# Patient Record
Sex: Female | Born: 2000 | Race: Black or African American | Hispanic: No | Marital: Single | Smoking: Never smoker
Health system: Southern US, Community
[De-identification: ages and names within clinical notes are randomized; demographics above are authoritative.]

## PROBLEM LIST (undated history)

## (undated) DIAGNOSIS — Z973 Presence of spectacles and contact lenses: Secondary | ICD-10-CM

## (undated) DIAGNOSIS — F419 Anxiety disorder, unspecified: Secondary | ICD-10-CM

## (undated) DIAGNOSIS — K5900 Constipation, unspecified: Secondary | ICD-10-CM

## (undated) DIAGNOSIS — E079 Disorder of thyroid, unspecified: Secondary | ICD-10-CM

## (undated) DIAGNOSIS — J189 Pneumonia, unspecified organism: Secondary | ICD-10-CM

## (undated) DIAGNOSIS — F32A Depression, unspecified: Secondary | ICD-10-CM

## (undated) DIAGNOSIS — N809 Endometriosis, unspecified: Secondary | ICD-10-CM

## (undated) DIAGNOSIS — E039 Hypothyroidism, unspecified: Secondary | ICD-10-CM

## (undated) HISTORY — PX: NO PAST SURGERIES: SHX2092

---
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
## 2021-01-01 ENCOUNTER — Emergency Department (HOSPITAL_COMMUNITY): Payer: 59

## 2021-01-01 DIAGNOSIS — Z8249 Family history of ischemic heart disease and other diseases of the circulatory system: Secondary | ICD-10-CM | POA: Diagnosis not present

## 2021-01-01 DIAGNOSIS — N736 Female pelvic peritoneal adhesions (postinfective): Secondary | ICD-10-CM | POA: Diagnosis present

## 2021-01-01 DIAGNOSIS — R109 Unspecified abdominal pain: Secondary | ICD-10-CM | POA: Diagnosis present

## 2021-01-01 DIAGNOSIS — E039 Hypothyroidism, unspecified: Secondary | ICD-10-CM | POA: Diagnosis present

## 2021-01-01 DIAGNOSIS — Z833 Family history of diabetes mellitus: Secondary | ICD-10-CM | POA: Diagnosis not present

## 2021-01-01 DIAGNOSIS — A419 Sepsis, unspecified organism: Secondary | ICD-10-CM | POA: Diagnosis present

## 2021-01-01 DIAGNOSIS — Z8616 Personal history of COVID-19: Secondary | ICD-10-CM | POA: Diagnosis not present

## 2021-01-01 DIAGNOSIS — N809 Endometriosis, unspecified: Secondary | ICD-10-CM | POA: Diagnosis present

## 2021-01-01 LAB — PROTIME-INR
INR: 1.1 (ref 0.8–1.2)
Prothrombin Time: 13.9 seconds (ref 11.4–15.2)

## 2021-01-01 LAB — URINALYSIS, ROUTINE W REFLEX MICROSCOPIC
Bilirubin Urine: NEGATIVE
Glucose, UA: NEGATIVE mg/dL
Ketones, ur: NEGATIVE mg/dL
Nitrite: NEGATIVE
Protein, ur: NEGATIVE mg/dL
Specific Gravity, Urine: 1.02 (ref 1.005–1.030)
pH: 6 (ref 5.0–8.0)

## 2021-01-01 LAB — RESP PANEL BY RT-PCR (FLU A&B, COVID) ARPGX2
Influenza A by PCR: NEGATIVE
Influenza B by PCR: NEGATIVE
SARS Coronavirus 2 by RT PCR: NEGATIVE

## 2021-01-01 LAB — CBC
HCT: 31.8 % — ABNORMAL LOW (ref 36.0–46.0)
Hemoglobin: 10 g/dL — ABNORMAL LOW (ref 12.0–15.0)
MCH: 24.6 pg — ABNORMAL LOW (ref 26.0–34.0)
MCHC: 31.4 g/dL (ref 30.0–36.0)
MCV: 78.1 fL — ABNORMAL LOW (ref 80.0–100.0)
Platelets: 273 10*3/uL (ref 150–400)
RBC: 4.07 MIL/uL (ref 3.87–5.11)
RDW: 15.9 % — ABNORMAL HIGH (ref 11.5–15.5)
WBC: 13.1 10*3/uL — ABNORMAL HIGH (ref 4.0–10.5)
nRBC: 0 % (ref 0.0–0.2)

## 2021-01-01 LAB — LACTIC ACID, PLASMA: Lactic Acid, Venous: 1.4 mmol/L (ref 0.5–1.9)

## 2021-01-01 LAB — APTT: aPTT: 30 seconds (ref 24–36)

## 2021-01-01 MED ORDER — OXYCODONE-ACETAMINOPHEN 5-325 MG PO TABS
1.0000 | ORAL_TABLET | ORAL | Status: DC | PRN
  Administered 2021-01-01: 1 via ORAL
  Filled 2021-01-01: qty 1

## 2021-01-01 MED ORDER — HYDROMORPHONE HCL 1 MG/ML IJ SOLN
0.2000 mg | INTRAMUSCULAR | Status: DC | PRN
  Administered 2021-01-01 – 2021-01-02 (×5): 0.5 mg via INTRAVENOUS
  Filled 2021-01-01 (×5): qty 1

## 2021-01-01 MED ORDER — ONDANSETRON HCL 4 MG PO TABS: 4.0000 mg | ORAL_TABLET | Freq: Four times a day (QID) | ORAL | Status: DC | PRN

## 2021-01-01 MED ORDER — SODIUM CHLORIDE 0.9 % IV SOLN
2.0000 g | Freq: Once | INTRAVENOUS | Status: AC
  Administered 2021-01-01: 2 g via INTRAVENOUS
  Filled 2021-01-01: qty 20

## 2021-01-01 MED ORDER — METRONIDAZOLE 500 MG/100ML IV SOLN
500.0000 mg | Freq: Once | INTRAVENOUS | Status: AC
  Administered 2021-01-01: 500 mg via INTRAVENOUS
  Filled 2021-01-01: qty 100

## 2021-01-01 MED ORDER — SODIUM CHLORIDE 0.9 % IV SOLN: 1.0000 g | INTRAVENOUS | Status: DC

## 2021-01-01 MED ORDER — LACTATED RINGERS IV BOLUS (SEPSIS)
1000.0000 mL | Freq: Once | INTRAVENOUS | Status: AC
  Administered 2021-01-01: 1000 mL via INTRAVENOUS

## 2021-01-01 MED ORDER — LACTATED RINGERS IV BOLUS (SEPSIS)
250.0000 mL | Freq: Once | INTRAVENOUS | Status: AC
  Administered 2021-01-01: 250 mL via INTRAVENOUS

## 2021-01-01 MED ORDER — KETOROLAC TROMETHAMINE 30 MG/ML IJ SOLN
30.0000 mg | Freq: Once | INTRAMUSCULAR | Status: AC
  Administered 2021-01-01: 30 mg via INTRAVENOUS
  Filled 2021-01-01: qty 1

## 2021-01-01 MED ORDER — METRONIDAZOLE 500 MG/100ML IV SOLN
500.0000 mg | Freq: Two times a day (BID) | INTRAVENOUS | Status: DC
  Administered 2021-01-01 – 2021-01-02 (×2): 500 mg via INTRAVENOUS
  Filled 2021-01-01 (×2): qty 100

## 2021-01-01 MED ORDER — SIMETHICONE 80 MG PO CHEW: 80.0000 mg | CHEWABLE_TABLET | Freq: Four times a day (QID) | ORAL | Status: DC | PRN

## 2021-01-01 MED ORDER — ONDANSETRON HCL 4 MG/2ML IJ SOLN
4.0000 mg | Freq: Four times a day (QID) | INTRAMUSCULAR | Status: DC | PRN
  Administered 2021-01-01 – 2021-01-02 (×3): 4 mg via INTRAVENOUS
  Filled 2021-01-01 (×3): qty 2

## 2021-01-01 MED ORDER — ACETAMINOPHEN 500 MG PO TABS
1000.0000 mg | ORAL_TABLET | Freq: Four times a day (QID) | ORAL | Status: DC
  Administered 2021-01-01: 1000 mg via ORAL
  Filled 2021-01-01: qty 2

## 2021-01-01 MED ORDER — LACTATED RINGERS IV SOLN: INTRAVENOUS | Status: DC

## 2021-01-01 MED ORDER — LEVOTHYROXINE SODIUM 100 MCG PO TABS
100.0000 ug | ORAL_TABLET | Freq: Every day | ORAL | Status: DC
  Administered 2021-01-02: 100 ug via ORAL
  Filled 2021-01-01: qty 1

## 2021-01-01 MED ORDER — DOXYCYCLINE HYCLATE 100 MG PO TABS
100.0000 mg | ORAL_TABLET | Freq: Two times a day (BID) | ORAL | Status: DC
  Administered 2021-01-01 – 2021-01-02 (×2): 100 mg via ORAL
  Filled 2021-01-01 (×2): qty 1

## 2021-01-01 MED ORDER — OXYCODONE HCL 5 MG PO TABS
5.0000 mg | ORAL_TABLET | ORAL | Status: DC | PRN
  Administered 2021-01-01: 5 mg via ORAL
  Filled 2021-01-01: qty 1

## 2021-01-01 MED ORDER — SODIUM CHLORIDE 0.9 % IV SOLN
2.0000 g | INTRAVENOUS | Status: DC
  Administered 2021-01-02: 2 g via INTRAVENOUS
  Filled 2021-01-01: qty 20

## 2021-01-01 MED ORDER — ONDANSETRON HCL 4 MG/2ML IJ SOLN: 4.0000 mg | Freq: Once | INTRAMUSCULAR | Status: DC

## 2021-01-01 MED ORDER — PRENATAL MULTIVITAMIN CH
1.0000 | ORAL_TABLET | Freq: Every day | ORAL | Status: DC
  Administered 2021-01-01: 1 via ORAL
  Filled 2021-01-01: qty 1

## 2021-01-01 MED ORDER — ACETAMINOPHEN 500 MG PO TABS
1000.0000 mg | ORAL_TABLET | Freq: Once | ORAL | Status: AC
  Administered 2021-01-01: 1000 mg via ORAL
  Filled 2021-01-01: qty 2

## 2021-01-01 MED ORDER — IOHEXOL 350 MG/ML SOLN
75.0000 mL | Freq: Once | INTRAVENOUS | Status: AC | PRN
  Administered 2021-01-01: 75 mL via INTRAVENOUS

## 2021-01-01 MED ORDER — SODIUM CHLORIDE 0.9 % IV SOLN
2.0000 g | INTRAVENOUS | Status: DC
  Filled 2021-01-01: qty 20

## 2021-01-01 MED ORDER — HYDROMORPHONE HCL 1 MG/ML IJ SOLN
0.5000 mg | Freq: Once | INTRAMUSCULAR | Status: AC
Start: 2021-01-01 — End: 2021-01-01
  Administered 2021-01-01: 0.5 mg via INTRAVENOUS
  Filled 2021-01-01: qty 1

## 2021-01-01 MED ORDER — ONDANSETRON HCL 4 MG/2ML IJ SOLN
4.0000 mg | Freq: Once | INTRAMUSCULAR | Status: AC | PRN
  Administered 2021-01-01: 4 mg via INTRAVENOUS
  Filled 2021-01-01: qty 2

## 2021-01-01 NOTE — Sepsis Progress Note (Signed)
Following for sepsis monitoring ?

## 2021-01-01 NOTE — ED Provider Notes (Signed)
[facility]-EMERGENCY DEPT [facility] Emergency Department Provider Note MRN:  [id number]  Arrival date & time: 01/01/21     Chief Complaint   Abdominal Pain   History of Present Illness   Rebecca Vaughan is a 20 y.o. year-old female with a history of endometriosis presenting to the ED with chief complaint of abdominal pain.  Location: Diffuse Duration: 2 days Onset: Gradual Timing: Constant Description: Dull ache Severity: Moderate Exacerbating/Alleviating Factors: None Associated Symptoms: Fever Pertinent Negatives: No vaginal bleeding or discharge, chest pain or shortness of breath, no nausea vomiting or diarrhea  Additional History: Feels different from her endometriosis pain  Review of Systems  A complete 10 system review of systems was obtained and all systems are negative except as noted in the HPI and PMH.   Patient's Health History    Past Medical History:  Diagnosis Date   Constipation    COVID 10/2019   fever chills headache x 5 days all symptoms resolved   Endometriosis    Hypothyroidism    Pneumonia as child age 60   right lung   Thyroid disease    hyperthyroid   Wears glasses     Past Surgical History:  Procedure Laterality Date   LAPAROTOMY N/A 05/06/2020   Procedure: EXPLORATORY LAPAROTOMY, LYSIS OF ADHESIONS, DRAINAGE OF ENDOMETRIOMAS, PARTIAL RIGHT OVARIAN CYSTECTOMY;  Surgeon: Marcelle Overlie, MD;  Location: [facility];  Service: Gynecology;  Laterality: N/A;   NO PAST SURGERIES      Family History  Problem Relation Age of Onset   Hypertension Father    Diabetes Father    Hypertension Maternal Grandmother    Hypertension Paternal Grandmother    Diabetes Paternal Grandmother    Hypertension Paternal Grandfather    Diabetes Paternal Grandfather     Social History   Socioeconomic History   Marital status: Single    Spouse name: Not on file   Number of children: Not on file   Years of education: Not on file   Highest  education level: Not on file  Occupational History   Not on file  Tobacco Use   Smoking status: Never   Smokeless tobacco: Never   Tobacco comments:    No smokers at home  Vaping Use   Vaping Use: Never used  Substance and Sexual Activity   Alcohol use: Not Currently   Drug use: Not Currently   Sexual activity: Not on file  Other Topics Concern   Not on file  Social History Narrative   Is in 8th grade at [employer]   Social Determinants of Health   Financial Resource Strain: Not on file  Food Insecurity: Not on file  Transportation Needs: Not on file  Physical Activity: Not on file  Stress: Not on file  Social Connections: Not on file  Intimate Partner Violence: Not on file     Physical Exam   Vitals:   01/01/21 0236 01/01/21 0407  BP: (!) 83/43 120/70  Pulse: (!) 118 86  Resp: 16 17  Temp: (!) 100.9 F (38.3 C) 99.5 F (37.5 C)  SpO2: 100% 100%    CONSTITUTIONAL: Well-appearing, NAD NEURO:  Alert and oriented x 3, no focal deficits EYES:  eyes equal and reactive ENT/NECK:  no LAD, no JVD CARDIO: Tachycardic rate, well-perfused, normal S1 and S2 PULM:  CTAB no wheezing or rhonchi GI/GU:  normal bowel sounds, non-distended, diffuse tenderness with rebound MSK/SPINE:  No gross deformities, no edema SKIN:  no rash, atraumatic PSYCH:  Appropriate  speech and behavior  *Additional and/or pertinent findings included in MDM below  Diagnostic and Interventional Summary    EKG Interpretation  Date/Time:  Sunday January 01 2021 04:00:53 EDT Ventricular Rate:  88 PR Interval:  138 QRS Duration: 96 QT Interval:  366 QTC Calculation: 442 R Axis:   124 Text Interpretation: Suspect arm lead reversal, interpretation assumes no reversal Unusual P axis, possible ectopic atrial rhythm Right axis deviation Pulmonary disease pattern Cannot rule out Inferior infarct , age undetermined Abnormal ECG Confirmed by Kennis Carina [phone] on 01/01/2021 5:12:54 AM        Labs Reviewed  COMPREHENSIVE METABOLIC PANEL - Abnormal; Notable for the following components:      Result Value   Glucose, Bld 108 (*)    All other components within normal limits  CBC WITH DIFFERENTIAL/PLATELET - Abnormal; Notable for the following components:   WBC 12.2 (*)    Hemoglobin 11.7 (*)    MCV 79.0 (*)    MCH 24.3 (*)    RDW 15.7 (*)    Neutro Abs 9.8 (*)    All other components within normal limits  URINALYSIS, ROUTINE W REFLEX MICROSCOPIC - Abnormal; Notable for the following components:   APPearance HAZY (*)    Hgb urine dipstick MODERATE (*)    Leukocytes,Ua MODERATE (*)    Bacteria, UA RARE (*)    All other components within normal limits  RESP PANEL BY RT-PCR (FLU A&B, COVID) ARPGX2  CULTURE, BLOOD (ROUTINE X 2)  CULTURE, BLOOD (ROUTINE X 2)  LIPASE, BLOOD  LACTIC ACID, PLASMA  PROTIME-INR  APTT  LACTIC ACID, PLASMA  I-STAT BETA HCG BLOOD, ED ([facility], [facility], [facility] ONLY)    CT ABDOMEN PELVIS W CONTRAST  Final Result    DG Chest 1 View  Final Result      Medications  lactated ringers infusion (has no administration in time range)  lactated ringers bolus 1,000 mL (1,000 mLs Intravenous New Bag/Given 01/01/21 0342)    And  lactated ringers bolus 1,000 mL (1,000 mLs Intravenous New Bag/Given 01/01/21 0342)    And  lactated ringers bolus 250 mL (has no administration in time range)  metroNIDAZOLE (FLAGYL) IVPB 500 mg (has no administration in time range)  acetaminophen (TYLENOL) tablet 1,000 mg (1,000 mg Oral Given 01/01/21 0241)  cefTRIAXone (ROCEPHIN) 2 g in sodium chloride 0.9 % 100 mL IVPB (2 g Intravenous New Bag/Given 01/01/21 0448)  iohexol (OMNIPAQUE) 350 MG/ML injection 75 mL (75 mLs Intravenous Contrast Given 01/01/21 0423)     Procedures  /  Critical Care .Critical Care E&M Performed by: Sabas Sous, MD  Critical care provider statement:    Critical care time (minutes):  33   Critical care time was exclusive of:  Separately billable  procedures and treating other patients   Critical care was necessary to treat or prevent imminent or life-threatening deterioration of the following conditions:  Sepsis   Critical care was time spent personally by me on the following activities:  Development of treatment plan with patient or surrogate, discussions with consultants, examination of patient, review of old charts, re-evaluation of patient's condition, ordering and review of radiographic studies, ordering and review of laboratory studies and ordering and performing treatments and interventions After initial E/M assessment, critical care services were subsequently performed that were exclusive of separately billable procedures or treatment.    ED Course and Medical Decision Making  I have reviewed the triage vital signs, the nursing notes, and pertinent available records from  the EMR.  Listed above are laboratory and imaging tests that I personally ordered, reviewed, and interpreted and then considered in my medical decision making (see below for details).  Patient with diffuse abdominal pain, guarding on exam.  Febrile, tachycardic, hypotensive, leukocytosis.  Code sepsis initiated, providing 30 cc/kg fluids, intra-abdominal antibiotic coverage, awaiting labs and CT imaging.  Concern for intra-abdominal abscess, perforated viscus, etc.     CT revealing endometriomas with likely superinfection.  Patient responding well to fluids and antibiotics, tachycardia and hypotension resolved.  Case discussed with both Dr. Robin Searing of OB/GYN, will admit.  Elmer Sow. Pilar Plate, MD [facility] Emergency Medicine [facility] [email or web address]  Final Clinical Impressions(s) / ED Diagnoses     ICD-10-CM   1. Intra-abdominal infection  B99.9     2. Sepsis (HCC)  A41.9 DG Chest 1 View    DG Chest 1 View    3. Endometrioma  N80.129     4. Sepsis, due to unspecified organism, unspecified whether acute organ dysfunction present  [facility])  A41.9       ED Discharge Orders     None        Discharge Instructions Discussed with and Provided to Patient:   Discharge Instructions   None       Sabas Sous, MD 01/01/21 [phone]

## 2021-01-01 NOTE — ED Notes (Signed)
Patient transported to X-ray 

## 2021-01-01 NOTE — ED Notes (Signed)
Patient was given a emesis bag. 

## 2021-01-01 NOTE — H&P (Signed)
Rebecca Vaughan is an 20 y.o. female. In February of this year she underwent exploratory laparotomy with Dr Vincente Poli for a 10 cm endometrioma that was partially resected. She was then placed on Orilissa and Slynd (drospirenone). Postoperatively she noted pain relief. In May U/S > 6.6 cm and 3.5 cm right endometriomas and 1.9 cm left endometrioma. Clinically improved so Orilissa continued. Repeat U/S June 30 > 7cm and 3.9cm right endometriomas and 2.1 cm left endometrioma. She was clinically stable and the plan was to repeat U/S in 3 months. She C/O increasing pain over the last 2-3 months with her menses. 2-3 days ago had more pelvic pain. LMP about 6 days ago. She presents to ED with pain. At home denies fever/chills, N/V. Normal BM and normal bladder function. Now more comfortable after IV fluids and tylenol and ibuprofen.  Pertinent Gynecological History: Menses: flow is moderate Bleeding:  Contraception: oral progesterone-only contraceptive DES exposure: denies Blood transfusions: none Sexually transmitted diseases: no past history Previous GYN Procedures:  see above   Last mammogram: Date:  Last pap:  Date:  OB History: G0, P0   Menstrual History: Menarche age: unknown Patient's last menstrual period was 12/25/2020.    Past Medical History:  Diagnosis Date   Constipation    COVID 10/2019   fever chills headache x 5 days all symptoms resolved   Endometriosis    Hypothyroidism    Pneumonia as child age 76   right lung   Thyroid disease    hyperthyroid   Wears glasses     Past Surgical History:  Procedure Laterality Date   LAPAROTOMY N/A 05/06/2020   Procedure: EXPLORATORY LAPAROTOMY, LYSIS OF ADHESIONS, DRAINAGE OF ENDOMETRIOMAS, PARTIAL RIGHT OVARIAN CYSTECTOMY;  Surgeon: Marcelle Overlie, MD;  Location: [facility] [place];  Service: Gynecology;  Laterality: N/A;   NO PAST SURGERIES      Family History  Problem Relation Age of Onset   Hypertension Father     Diabetes Father    Hypertension Maternal Grandmother    Hypertension Paternal Grandmother    Diabetes Paternal Grandmother    Hypertension Paternal Grandfather    Diabetes Paternal Grandfather     Social History:  reports that she has never smoked. She has never used smokeless tobacco. She reports that she does not currently use alcohol. She reports that she does not currently use drugs.  Allergies:  Allergies  Allergen Reactions   Kiwi Extract     Lips get black scabs    (Not in a hospital admission)   Review of Systems  Constitutional:  Negative for chills.  Eyes:  Negative for visual disturbance.  Gastrointestinal:  Positive for abdominal pain.   Blood pressure 119/70, pulse 88, temperature 99.5 F (37.5 C), temperature source Oral, resp. rate 16, height 5\' 3"  (1.6 m), weight 68 kg, last menstrual period 12/25/2020, SpO2 100 %. Physical Exam Constitutional:      Appearance: She is well-developed.  HENT:     Head: Normocephalic.  Cardiovascular:     Rate and Rhythm: Normal rate.  Pulmonary:     Effort: Pulmonary effort is normal.     Breath sounds: Normal breath sounds.  Abdominal:     General: Abdomen is flat. Bowel sounds are normal.     Palpations: Abdomen is soft.     Tenderness: There is abdominal tenderness in the right lower quadrant and left lower quadrant. There is no guarding or rebound.  Neurological:     Mental Status: She is alert.  Ambulates to BR  Results for orders placed or performed during the hospital encounter of 12/31/20 (from the past 24 hour(s))  Comprehensive metabolic panel     Status: Abnormal   Collection Time: 12/31/20  5:24 PM  Result Value Ref Range   Sodium 137 135 - 145 mmol/L   Potassium 3.7 3.5 - 5.1 mmol/L   Chloride 106 98 - 111 mmol/L   CO2 23 22 - 32 mmol/L   Glucose, Bld 108 (H) 70 - 99 mg/dL   BUN 10 6 - 20 mg/dL   Creatinine, Ser 5.59 0.44 - 1.00 mg/dL   Calcium 9.0 8.9 - 74.1 mg/dL   Total Protein 7.3 6.5 - 8.1 g/dL    Albumin 3.8 3.5 - 5.0 g/dL   AST 20 15 - 41 U/L   ALT 15 0 - 44 U/L   Alkaline Phosphatase 55 38 - 126 U/L   Total Bilirubin 0.5 0.3 - 1.2 mg/dL   GFR, Estimated >63 >84 mL/min   Anion gap 8 5 - 15  CBC with Differential     Status: Abnormal   Collection Time: 12/31/20  5:24 PM  Result Value Ref Range   WBC 12.2 (H) 4.0 - 10.5 K/uL   RBC 4.81 3.87 - 5.11 MIL/uL   Hemoglobin 11.7 (L) 12.0 - 15.0 g/dL   HCT 53.6 46.8 - 03.2 %   MCV 79.0 (L) 80.0 - 100.0 fL   MCH 24.3 (L) 26.0 - 34.0 pg   MCHC 30.8 30.0 - 36.0 g/dL   RDW 12.2 (H) 48.2 - 50.0 %   Platelets 324 150 - 400 K/uL   nRBC 0.0 0.0 - 0.2 %   Neutrophils Relative % 81 %   Neutro Abs 9.8 (H) 1.7 - 7.7 K/uL   Lymphocytes Relative 13 %   Lymphs Abs 1.6 0.7 - 4.0 K/uL   Monocytes Relative 6 %   Monocytes Absolute 0.7 0.1 - 1.0 K/uL   Eosinophils Relative 0 %   Eosinophils Absolute 0.0 0.0 - 0.5 K/uL   Basophils Relative 0 %   Basophils Absolute 0.0 0.0 - 0.1 K/uL   Immature Granulocytes 0 %   Abs Immature Granulocytes 0.02 0.00 - 0.07 K/uL  Lipase, blood     Status: None   Collection Time: 12/31/20  5:24 PM  Result Value Ref Range   Lipase 24 11 - 51 U/L  I-Stat beta hCG blood, ED     Status: None   Collection Time: 12/31/20  6:26 PM  Result Value Ref Range   I-stat hCG, quantitative <5.0 <5 mIU/mL   Comment 3          Lactic acid, plasma     Status: None   Collection Time: 01/01/21  3:35 AM  Result Value Ref Range   Lactic Acid, Venous 1.4 0.5 - 1.9 mmol/L  Resp Panel by RT-PCR (Flu A&B, Covid) Nasopharyngeal Swab     Status: None   Collection Time: 01/01/21  3:39 AM   Specimen: Nasopharyngeal Swab; Nasopharyngeal(NP) swabs in vial transport medium  Result Value Ref Range   SARS Coronavirus 2 by RT PCR NEGATIVE NEGATIVE   Influenza A by PCR NEGATIVE NEGATIVE   Influenza B by PCR NEGATIVE NEGATIVE  Protime-INR     Status: None   Collection Time: 01/01/21  3:39 AM  Result Value Ref Range   Prothrombin Time 13.9  11.4 - 15.2 seconds   INR 1.1 0.8 - 1.2  APTT     Status: None  Collection Time: 01/01/21  3:39 AM  Result Value Ref Range   aPTT 30 24 - 36 seconds  Urinalysis, Routine w reflex microscopic     Status: Abnormal   Collection Time: 01/01/21  4:26 AM  Result Value Ref Range   Color, Urine YELLOW YELLOW   APPearance HAZY (A) CLEAR   Specific Gravity, Urine 1.020 1.005 - 1.030   pH 6.0 5.0 - 8.0   Glucose, UA NEGATIVE NEGATIVE mg/dL   Hgb urine dipstick MODERATE (A) NEGATIVE   Bilirubin Urine NEGATIVE NEGATIVE   Ketones, ur NEGATIVE NEGATIVE mg/dL   Protein, ur NEGATIVE NEGATIVE mg/dL   Nitrite NEGATIVE NEGATIVE   Leukocytes,Ua MODERATE (A) NEGATIVE   RBC / HPF 6-10 0 - 5 RBC/hpf   WBC, UA 11-20 0 - 5 WBC/hpf   Bacteria, UA RARE (A) NONE SEEN   Squamous Epithelial / LPF 11-20 0 - 5   Mucus PRESENT     DG Chest 1 View  Result Date: 01/01/2021 CLINICAL DATA:  Sore throat EXAM: CHEST  1 VIEW COMPARISON:  None. FINDINGS: Lungs are clear.  No pleural effusion or pneumothorax. The heart is normal in size. IMPRESSION: No evidence of acute cardiopulmonary disease. Electronically Signed   By: Charline Bills M.D.   On: 01/01/2021 03:12   CT ABDOMEN PELVIS W CONTRAST  Result Date: 01/01/2021 CLINICAL DATA:  Abdominal abscess or infection suspected.  Pain. EXAM: CT ABDOMEN AND PELVIS WITH CONTRAST TECHNIQUE: Multidetector CT imaging of the abdomen and pelvis was performed using the standard protocol following bolus administration of intravenous contrast. CONTRAST:  72mL OMNIPAQUE IOHEXOL 350 MG/ML SOLN COMPARISON:  03/23/2020 FINDINGS: Lower chest:  No contributory findings. Hepatobiliary: No worrisome liver abnormality.No evidence of biliary obstruction or stone. Pancreas: Unremarkable. Spleen: Unremarkable. Adrenals/Urinary Tract: Negative adrenals. No hydronephrosis or stone. Unremarkable bladder. Stomach/Bowel:  No obstruction. No appendicitis. Vascular/Lymphatic: No acute vascular  abnormality. No mass or adenopathy. Reproductive history of endometrial mass with interval decompression recurrent and L thick walled midline to right paramedian collections in the pelvis, the larger of midline measuring up to 7 cm in more lateral component measuring 4 cm. Regional fat stranding and peritoneal fluid. The right ovary is difficult to separate from the cystic pelvic masses; no visible edematous ovary. Other: No ascites or pneumoperitoneum. Presumed dermal inclusion cyst in the subcutaneous left paramedian lower chest Musculoskeletal: No acute abnormalities. IMPRESSION: History of endometriomas with residual or recurrent collections in the pelvis that are newly thick walled with regional inflammatory changes suggesting superinfection. The largest measures 7 cm Electronically Signed   By: Tiburcio Pea M.D.   On: 01/01/2021 04:42    Upon presentation to ED her temp was 99.6> 100.9 > 99.5 Now S/P Rocephin 2 gm IV, IV fluids VS are stable   Assessment/Plan: 20 yo G0P0 with known Stage 4 endometriosis and endometriomas Pain now worse At this time she is stable C/W patient and her mother plan for admission, IV fluids, clear fluids, continue antibiotics CBC this afternoon and will coordinate a plan for further treatment and/or surgery  Roselle Locus II 01/01/2021, 7:11 AM

## 2021-01-01 NOTE — ED Notes (Signed)
Currently denies nausea. Will continue to monitor.

## 2021-01-01 NOTE — Progress Notes (Signed)
Feeling better, still has lower abdominal discomfort but is able to rest Tolerating clear liquids, no N/V since early this am Ambulates to BR without difficulty  Today's Vitals   01/01/21 1240 01/01/21 1319 01/01/21 1342 01/01/21 1429  BP:  109/63 115/68   Pulse:  82 99   Resp:  15 18   Temp:  99.2 F (37.3 C) 98.4 F (36.9 C)   TempSrc:  Oral Oral   SpO2:  99% 100%   Weight:      Height:      PainSc: 2    6    Body mass index is 26.57 kg/m.   Abdomen flat, soft, lower quad tenderness without rebound  Results for orders placed or performed during the hospital encounter of 12/31/20 (from the past 24 hour(s))  I-Stat beta hCG blood, ED     Status: None   Collection Time: 12/31/20  6:26 PM  Result Value Ref Range   I-stat hCG, quantitative <5.0 <5 mIU/mL   Comment 3          Lactic acid, plasma     Status: None   Collection Time: 01/01/21  3:35 AM  Result Value Ref Range   Lactic Acid, Venous 1.4 0.5 - 1.9 mmol/L  Resp Panel by RT-PCR (Flu A&B, Covid) Nasopharyngeal Swab     Status: None   Collection Time: 01/01/21  3:39 AM   Specimen: Nasopharyngeal Swab; Nasopharyngeal(NP) swabs in vial transport medium  Result Value Ref Range   SARS Coronavirus 2 by RT PCR NEGATIVE NEGATIVE   Influenza A by PCR NEGATIVE NEGATIVE   Influenza B by PCR NEGATIVE NEGATIVE  Protime-INR     Status: None   Collection Time: 01/01/21  3:39 AM  Result Value Ref Range   Prothrombin Time 13.9 11.4 - 15.2 seconds   INR 1.1 0.8 - 1.2  APTT     Status: None   Collection Time: 01/01/21  3:39 AM  Result Value Ref Range   aPTT 30 24 - 36 seconds  Urinalysis, Routine w reflex microscopic     Status: Abnormal   Collection Time: 01/01/21  4:26 AM  Result Value Ref Range   Color, Urine YELLOW YELLOW   APPearance HAZY (A) CLEAR   Specific Gravity, Urine 1.020 1.005 - 1.030   pH 6.0 5.0 - 8.0   Glucose, UA NEGATIVE NEGATIVE mg/dL   Hgb urine dipstick MODERATE (A) NEGATIVE   Bilirubin Urine NEGATIVE  NEGATIVE   Ketones, ur NEGATIVE NEGATIVE mg/dL   Protein, ur NEGATIVE NEGATIVE mg/dL   Nitrite NEGATIVE NEGATIVE   Leukocytes,Ua MODERATE (A) NEGATIVE   RBC / HPF 6-10 0 - 5 RBC/hpf   WBC, UA 11-20 0 - 5 WBC/hpf   Bacteria, UA RARE (A) NONE SEEN   Squamous Epithelial / LPF 11-20 0 - 5   Mucus PRESENT   CBC     Status: Abnormal   Collection Time: 01/01/21  3:04 PM  Result Value Ref Range   WBC 13.1 (H) 4.0 - 10.5 K/uL   RBC 4.07 3.87 - 5.11 MIL/uL   Hemoglobin 10.0 (L) 12.0 - 15.0 g/dL   HCT 38.2 (L) 50.5 - 39.7 %   MCV 78.1 (L) 80.0 - 100.0 fL   MCH 24.6 (L) 26.0 - 34.0 pg   MCHC 31.4 30.0 - 36.0 g/dL   RDW 67.3 (H) 41.9 - 37.9 %   Platelets 273 150 - 400 K/uL   nRBC 0.0 0.0 - 0.2 %  A/P: Stage 4 endometriosis with known recurrent endometriomas         Stable         Continue IV ceftriaxone/metronidazole, doxcycline 100mg  po q12 hrs         Clear liquids          D/W Dr , patient's primary gynecologist. Patient is stable and does not have an acute abdomen. She is more comfortable now. Pelvic masses on CT are C/W same sized endometriomas seen on office U/S in June. Will continue antibiotics to treat possible infection. Dr July will see the patient tomorrow am.

## 2021-01-02 LAB — CBC
HCT: 33 % — ABNORMAL LOW (ref 36.0–46.0)
Hemoglobin: 10.3 g/dL — ABNORMAL LOW (ref 12.0–15.0)
MCH: 24.5 pg — ABNORMAL LOW (ref 26.0–34.0)
MCHC: 31.2 g/dL (ref 30.0–36.0)
MCV: 78.6 fL — ABNORMAL LOW (ref 80.0–100.0)
Platelets: 266 10*3/uL (ref 150–400)
RBC: 4.2 MIL/uL (ref 3.87–5.11)
RDW: 15.9 % — ABNORMAL HIGH (ref 11.5–15.5)
WBC: 13.7 10*3/uL — ABNORMAL HIGH (ref 4.0–10.5)
nRBC: 0 % (ref 0.0–0.2)

## 2021-01-02 LAB — RESPIRATORY PANEL BY PCR

## 2021-01-02 NOTE — Progress Notes (Signed)
Patient discharged to [facility] transported by [facility].

## 2021-01-02 NOTE — Progress Notes (Signed)
Called [facility] for report, report given to [place]. Transport not available at this time. [facility] transport will call for update.

## 2021-01-02 NOTE — Progress Notes (Signed)
20 year old with known endometriosis (Stage 4) had drainage of endometriomas in February. She was placed on Orilissa.  She was stable until recently - admitted yesterday with nausea, vomiting and fever. CT scan 7 cm endometrioma with stranding consistent with superinfection. She has been on Rocephin since admission.  She needs to be transferred to [facility] for further care and consideration for minimally invasive surgery

## 2021-01-02 NOTE — Progress Notes (Signed)
S:  This am, patient is still nauseated.  Pain with movement.   O:  BP 114/63 (BP Location: Left Arm)   Pulse (!) 109   Temp 98.9 F (37.2 C) (Oral)   Resp 18   Ht 5\' 3"  (1.6 m)   Wt 68 kg   LMP 12/25/2020   SpO2 97%   BMI 26.57 kg/m  Results for orders placed or performed during the hospital encounter of 12/31/20 (from the past 24 hour(s))  CBC     Status: Abnormal   Collection Time: 01/01/21  3:04 PM  Result Value Ref Range   WBC 13.1 (H) 4.0 - 10.5 K/uL   RBC 4.07 3.87 - 5.11 MIL/uL   Hemoglobin 10.0 (L) 12.0 - 15.0 g/dL   HCT 01/03/21 (L) 49.1 - 79.1 %   MCV 78.1 (L) 80.0 - 100.0 fL   MCH 24.6 (L) 26.0 - 34.0 pg   MCHC 31.4 30.0 - 36.0 g/dL   RDW 50.5 (H) 69.7 - 94.8 %   Platelets 273 150 - 400 K/uL   nRBC 0.0 0.0 - 0.2 %  CBC     Status: Abnormal   Collection Time: 01/02/21  1:18 AM  Result Value Ref Range   WBC 13.7 (H) 4.0 - 10.5 K/uL   RBC 4.20 3.87 - 5.11 MIL/uL   Hemoglobin 10.3 (L) 12.0 - 15.0 g/dL   HCT 01/04/21 (L) 55.3 - 74.8 %   MCV 78.6 (L) 80.0 - 100.0 fL   MCH 24.5 (L) 26.0 - 34.0 pg   MCHC 31.2 30.0 - 36.0 g/dL   RDW 27.0 (H) 78.6 - 75.4 %   Platelets 266 150 - 400 K/uL   nRBC 0.0 0.0 - 0.2 %   Abdomen mildy distended  Tender diffusely   IMPRESSION: Stage 4 endometriosis with known recurrent endometriomas Possible superinfection  This patient is most likely going to required another surgery . 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 Spoke to [facility] Dr. [facility] - reviewed case - they have agreed to accept transfer of patient today.  Patient received first COVID vaccine - no booster  COVID swab just performed - results pending.

## 2021-01-02 NOTE — Discharge Summary (Signed)
Admission Diagnosis:  Stage 4 Endometriosis  Sepsis  Discharge Diagnosis: Same Endometrioma   Hospital course: 20 year old female G 0 with Stage 4 Endometriosis. Had surgery in February of this year - drainage of endometriomas and significant pelvic adhesions noted at time of surgery.   She was placed on Oril[place]. Seen in June 7 cm endometrioma noted but patient felt clinically fine. Developed acute onset of abdominal pain, nausea and vomiting and fever yesterday.  In ED - CT showed 7 cm endometrioma with stranding ? Superinfection  Results for orders placed or performed during the hospital encounter of 12/31/20 (from the past 24 hour(s))  CBC     Status: Abnormal   Collection Time: 01/01/21  3:04 PM  Result Value Ref Range   WBC 13.1 (H) 4.0 - 10.5 K/uL   RBC 4.07 3.87 - 5.11 MIL/uL   Hemoglobin 10.0 (L) 12.0 - 15.0 g/dL   HCT 76.2 (L) 83.1 - 51.7 %   MCV 78.1 (L) 80.0 - 100.0 fL   MCH 24.6 (L) 26.0 - 34.0 pg   MCHC 31.4 30.0 - 36.0 g/dL   RDW 61.6 (H) 07.3 - 71.0 %   Platelets 273 150 - 400 K/uL   nRBC 0.0 0.0 - 0.2 %  CBC     Status: Abnormal   Collection Time: 01/02/21  1:18 AM  Result Value Ref Range   WBC 13.7 (H) 4.0 - 10.5 K/uL   RBC 4.20 3.87 - 5.11 MIL/uL   Hemoglobin 10.3 (L) 12.0 - 15.0 g/dL   HCT 62.6 (L) 94.8 - 54.6 %   MCV 78.6 (L) 80.0 - 100.0 fL   MCH 24.5 (L) 26.0 - 34.0 pg   MCHC 31.2 30.0 - 36.0 g/dL   RDW 27.0 (H) 35.0 - 09.3 %   Platelets 266 150 - 400 K/uL   nRBC 0.0 0.0 - 0.2 %   Patient admitted and placed on pain medications and Rocephin and Doxycycline. She is only minimally improved today. Abdomen is slightly distended and mildly tender throughout  No surgical abdomen   Recommend transfer to tertiary care center because she will mostly likely require minimally invasive surgery which will be complicated because of pelvic adhesive disease  Spoke to [facility] and they have accepted transfer

## 2021-01-05 DIAGNOSIS — N7093 Salpingitis and oophoritis, unspecified: Secondary | ICD-10-CM | POA: Insufficient documentation

## 2021-01-05 DIAGNOSIS — K5900 Constipation, unspecified: Secondary | ICD-10-CM | POA: Insufficient documentation

## 2021-01-06 LAB — CULTURE, BLOOD (ROUTINE X 2)
Culture: NO GROWTH
Culture: NO GROWTH
Special Requests: ADEQUATE
Special Requests: ADEQUATE

## 2021-08-06 ENCOUNTER — Inpatient Hospital Stay (HOSPITAL_COMMUNITY)
Admission: AD | Admit: 2021-08-06 | Discharge: 2021-08-12 | DRG: 885 | Disposition: A | Discharge status: Home / Self Care | Payer: 59 | Source: Intra-hospital | Attending: Emergency Medicine | Admitting: Emergency Medicine

## 2021-08-06 ENCOUNTER — Emergency Department (HOSPITAL_COMMUNITY)
Admission: EM | Admit: 2021-08-06 | Discharge: 2021-08-06 | Disposition: A | Discharge status: Other Acute Inpatient Hospital | Payer: 59 | Attending: Emergency Medicine | Admitting: Emergency Medicine

## 2021-08-06 ENCOUNTER — Other Ambulatory Visit: Payer: Self-pay

## 2021-08-06 ENCOUNTER — Encounter (HOSPITAL_COMMUNITY): Payer: Self-pay

## 2021-08-06 ENCOUNTER — Other Ambulatory Visit: Payer: Self-pay | Admitting: Psychiatry

## 2021-08-06 ENCOUNTER — Encounter (HOSPITAL_COMMUNITY): Payer: Self-pay | Admitting: Registered Nurse

## 2021-08-06 DIAGNOSIS — F17200 Nicotine dependence, unspecified, uncomplicated: Secondary | ICD-10-CM | POA: Diagnosis present

## 2021-08-06 DIAGNOSIS — F32A Depression, unspecified: Secondary | ICD-10-CM

## 2021-08-06 DIAGNOSIS — Z9151 Personal history of suicidal behavior: Secondary | ICD-10-CM

## 2021-08-06 DIAGNOSIS — F419 Anxiety disorder, unspecified: Secondary | ICD-10-CM

## 2021-08-06 DIAGNOSIS — E039 Hypothyroidism, unspecified: Secondary | ICD-10-CM | POA: Diagnosis present

## 2021-08-06 DIAGNOSIS — F430 Acute stress reaction: Secondary | ICD-10-CM | POA: Diagnosis present

## 2021-08-06 DIAGNOSIS — G8929 Other chronic pain: Secondary | ICD-10-CM | POA: Diagnosis present

## 2021-08-06 DIAGNOSIS — X838XXA Intentional self-harm by other specified means, initial encounter: Secondary | ICD-10-CM | POA: Insufficient documentation

## 2021-08-06 DIAGNOSIS — Z20822 Contact with and (suspected) exposure to covid-19: Secondary | ICD-10-CM | POA: Diagnosis present

## 2021-08-06 DIAGNOSIS — Z8701 Personal history of pneumonia (recurrent): Secondary | ICD-10-CM

## 2021-08-06 DIAGNOSIS — Z8616 Personal history of COVID-19: Secondary | ICD-10-CM | POA: Diagnosis not present

## 2021-08-06 DIAGNOSIS — T43292A Poisoning by other antidepressants, intentional self-harm, initial encounter: Secondary | ICD-10-CM | POA: Insufficient documentation

## 2021-08-06 DIAGNOSIS — Z818 Family history of other mental and behavioral disorders: Secondary | ICD-10-CM

## 2021-08-06 DIAGNOSIS — F332 Major depressive disorder, recurrent severe without psychotic features: Principal | ICD-10-CM | POA: Diagnosis present

## 2021-08-06 DIAGNOSIS — Z8349 Family history of other endocrine, nutritional and metabolic diseases: Secondary | ICD-10-CM

## 2021-08-06 DIAGNOSIS — T50902A Poisoning by unspecified drugs, medicaments and biological substances, intentional self-harm, initial encounter: Secondary | ICD-10-CM

## 2021-08-06 DIAGNOSIS — N809 Endometriosis, unspecified: Secondary | ICD-10-CM | POA: Diagnosis present

## 2021-08-06 DIAGNOSIS — G4700 Insomnia, unspecified: Secondary | ICD-10-CM | POA: Diagnosis present

## 2021-08-06 DIAGNOSIS — Z91018 Allergy to other foods: Secondary | ICD-10-CM

## 2021-08-06 DIAGNOSIS — F329 Major depressive disorder, single episode, unspecified: Secondary | ICD-10-CM | POA: Diagnosis present

## 2021-08-06 DIAGNOSIS — F322 Major depressive disorder, single episode, severe without psychotic features: Principal | ICD-10-CM | POA: Diagnosis present

## 2021-08-06 DIAGNOSIS — T1491XA Suicide attempt, initial encounter: Secondary | ICD-10-CM

## 2021-08-06 HISTORY — DX: Depression, unspecified: F32.A

## 2021-08-06 HISTORY — DX: Anxiety disorder, unspecified: F41.9

## 2021-08-06 LAB — CBC WITH DIFFERENTIAL/PLATELET
Abs Immature Granulocytes: 0.03 10*3/uL (ref 0.00–0.07)
Basophils Absolute: 0 10*3/uL (ref 0.0–0.1)
Basophils Relative: 0 %
Eosinophils Absolute: 0 10*3/uL (ref 0.0–0.5)
Eosinophils Relative: 1 %
HCT: 42.5 % (ref 36.0–46.0)
Hemoglobin: 13.2 g/dL (ref 12.0–15.0)
Immature Granulocytes: 0 %
Lymphocytes Relative: 29 %
Lymphs Abs: 2.5 10*3/uL (ref 0.7–4.0)
MCH: 24.2 pg — ABNORMAL LOW (ref 26.0–34.0)
MCHC: 31.1 g/dL (ref 30.0–36.0)
MCV: 78 fL — ABNORMAL LOW (ref 80.0–100.0)
Monocytes Absolute: 0.6 10*3/uL (ref 0.1–1.0)
Monocytes Relative: 7 %
Neutro Abs: 5.3 10*3/uL (ref 1.7–7.7)
Neutrophils Relative %: 63 %
Platelets: 325 10*3/uL (ref 150–400)
RBC: 5.45 MIL/uL — ABNORMAL HIGH (ref 3.87–5.11)
RDW: 16.2 % — ABNORMAL HIGH (ref 11.5–15.5)
WBC: 8.5 10*3/uL (ref 4.0–10.5)
nRBC: 0 % (ref 0.0–0.2)

## 2021-08-06 LAB — I-STAT BETA HCG BLOOD, ED (MC, WL, AP ONLY): I-stat hCG, quantitative: <5 m[IU]/mL (ref <5)

## 2021-08-06 LAB — COMPREHENSIVE METABOLIC PANEL
ALT: 14 U/L (ref 0–44)
AST: 17 U/L (ref 15–41)
Albumin: 3.7 g/dL (ref 3.5–5.0)
Alkaline Phosphatase: 66 U/L (ref 38–126)
Anion gap: 7 (ref 5–15)
BUN: 7 mg/dL (ref 6–20)
CO2: 21 mmol/L — ABNORMAL LOW (ref 22–32)
Calcium: 9.4 mg/dL (ref 8.9–10.3)
Chloride: 107 mmol/L (ref 98–111)
Creatinine, Ser: 0.72 mg/dL (ref 0.44–1.00)
GFR, Estimated: >60 mL/min (ref >60)
Glucose, Bld: 99 mg/dL (ref 70–99)
Potassium: 3.8 mmol/L (ref 3.5–5.1)
Sodium: 135 mmol/L (ref 135–145)
Total Bilirubin: 0.6 mg/dL (ref 0.3–1.2)
Total Protein: 7.5 g/dL (ref 6.5–8.1)

## 2021-08-06 LAB — RAPID URINE DRUG SCREEN, HOSP PERFORMED
Amphetamines: NOT DETECTED
Barbiturates: NOT DETECTED
Benzodiazepines: NOT DETECTED
Cocaine: NOT DETECTED
Opiates: NOT DETECTED
Tetrahydrocannabinol: NOT DETECTED

## 2021-08-06 LAB — RESP PANEL BY RT-PCR (FLU A&B, COVID) ARPGX2
Influenza A by PCR: NEGATIVE
Influenza B by PCR: NEGATIVE
SARS Coronavirus 2 by RT PCR: NEGATIVE

## 2021-08-06 LAB — SALICYLATE LEVEL: Salicylate Lvl: <7 mg/dL — ABNORMAL LOW (ref 7.0–30.0)

## 2021-08-06 LAB — ETHANOL: Alcohol, Ethyl (B): <10 mg/dL (ref <10)

## 2021-08-06 LAB — ACETAMINOPHEN LEVEL: Acetaminophen (Tylenol), Serum: <10 ug/mL — ABNORMAL LOW (ref 10–30)

## 2021-08-06 MED ORDER — MAGNESIUM HYDROXIDE 400 MG/5ML PO SUSP: 15.0000 mL | Freq: Every day | ORAL | Status: DC | PRN

## 2021-08-06 MED ORDER — ALUM & MAG HYDROXIDE-SIMETH 200-200-20 MG/5ML PO SUSP: 15.0000 mL | ORAL | Status: DC | PRN

## 2021-08-06 MED ORDER — ENSURE ENLIVE PO LIQD
237.0000 mL | Freq: Two times a day (BID) | ORAL | Status: DC
  Administered 2021-08-07 – 2021-08-11 (×5): 237 mL via ORAL
  Filled 2021-08-06 (×15): qty 237

## 2021-08-06 MED ORDER — TRAZODONE HCL 50 MG PO TABS: 50.0000 mg | ORAL_TABLET | Freq: Every evening | ORAL | Status: DC | PRN

## 2021-08-06 MED ORDER — ACETAMINOPHEN 325 MG PO TABS
650.0000 mg | ORAL_TABLET | Freq: Four times a day (QID) | ORAL | Status: DC | PRN
Start: 2021-08-06 — End: 2021-08-12
  Administered 2021-08-06 – 2021-08-11 (×2): 650 mg via ORAL
  Filled 2021-08-06 (×2): qty 2

## 2021-08-06 MED ORDER — HYDROXYZINE HCL 25 MG PO TABS
25.0000 mg | ORAL_TABLET | Freq: Three times a day (TID) | ORAL | Status: DC | PRN
  Administered 2021-08-06 – 2021-08-11 (×7): 25 mg via ORAL
  Filled 2021-08-06 (×7): qty 1

## 2021-08-06 NOTE — ED Notes (Signed)
Pt is currently visited by Ray Church (her mother) whom will also take her belongings upon leaving visitation hours

## 2021-08-06 NOTE — ED Notes (Signed)
Spoke w/ pt's mother who provided pertinent information about pt. Per pt's mother, pt was charged for stealing from her job. The pt's friend's mother took her to turn herself in on Wednesday. Pt's mother said pt told her she felt like she couldn't tell her mom because she thought she would be disappointed in her. Pt's mother is a retired Engineer, structural. Pt's mother reports pt has a court date on June 14th at 0800. Pt's mother's name is Esaw Dace and her phone number is [phone] if further information is needed and for updates.

## 2021-08-06 NOTE — ED Notes (Addendum)
Family member at bedside.

## 2021-08-06 NOTE — ED Notes (Signed)
Patient resting at this time. Even chest rise and fall observed.

## 2021-08-06 NOTE — ED Notes (Signed)
No sitter available per staffing office at this time.

## 2021-08-06 NOTE — ED Notes (Signed)
Called staffing for a sitter, not sitters available at this time per staffing. Notified CN. Pt has to have monitoring equipment d/t overdose. Tech being pulled to monitor pt until a sitter is available to maintain pt safety.

## 2021-08-06 NOTE — ED Provider Notes (Signed)
[facility] EMERGENCY DEPARTMENT Provider Note   CSN: [id number] Arrival date & time: 08/06/21  2947     History  Chief Complaint  Patient presents with  . Drug Overdose    Pt endorsed intentional overdose she initially described as a suicide attempt.  She now says that she was ''trying to hurt herself.''    Rebecca Vaughan is a 21 y.o. female.  Patient Overdosed on 6 tablets of 50 mg trazodone at 6:00 AM in suicide attempt. She has been feeling depressed for "a while." She has seen outside psychiatry and is feeling no better. Denies AVH, HI. No hx of psych hospitalization.  NO other drugs or etoh    Drug Overdose This is a new problem. Episode onset: 1 hour ago. Pertinent negatives include no chest pain, no abdominal pain, no headaches and no shortness of breath.      Home Medications Prior to Admission medications   Not on File      Allergies    Kiwi extract    Review of Systems   Review of Systems  Respiratory:  Negative for shortness of breath.   Cardiovascular:  Negative for chest pain.  Gastrointestinal:  Negative for abdominal pain.  Neurological:  Negative for headaches.   Physical Exam Updated Vital Signs BP 98/61 (BP Location: Left Arm)   Pulse 61   Temp 98.4 F (36.9 C) (Oral)   Resp 16   Ht 5\' 3"  (1.6 m)   Wt 70.3 kg   SpO2 100%   BMI 27.46 kg/m  Physical Exam Vitals and nursing note reviewed.  Constitutional:      General: She is not in acute distress.    Appearance: She is well-developed. She is not diaphoretic.  HENT:     Head: Normocephalic and atraumatic.     Right Ear: External ear normal.     Left Ear: External ear normal.     Nose: Nose normal.     Mouth/Throat:     Mouth: Mucous membranes are moist.  Eyes:     General: No scleral icterus.    Conjunctiva/sclera: Conjunctivae normal.  Cardiovascular:     Rate and Rhythm: Normal rate and regular rhythm.     Heart sounds: Normal heart sounds. No murmur heard.    No friction rub. No gallop.  Pulmonary:     Effort: Pulmonary effort is normal. No respiratory distress.     Breath sounds: Normal breath sounds.  Abdominal:     General: Bowel sounds are normal. There is no distension.     Palpations: Abdomen is soft. There is no mass.     Tenderness: There is no abdominal tenderness. There is no guarding.  Musculoskeletal:     Cervical back: Normal range of motion.  Skin:    General: Skin is warm and dry.  Neurological:     Mental Status: She is oriented to person, place, and time. She is lethargic.  Psychiatric:        Behavior: Behavior normal.    ED Results / Procedures / Treatments   Labs (all labs ordered are listed, but only abnormal results are displayed) Labs Reviewed  COMPREHENSIVE METABOLIC PANEL - Abnormal; Notable for the following components:      Result Value   CO2 21 (*)    All other components within normal limits  SALICYLATE LEVEL - Abnormal; Notable for the following components:   Salicylate Lvl <7.0 (*)    All other components within normal limits  ACETAMINOPHEN LEVEL - Abnormal; Notable for the following components:   Acetaminophen (Tylenol), Serum <10 (*)    All other components within normal limits  CBC WITH DIFFERENTIAL/PLATELET - Abnormal; Notable for the following components:   RBC 5.45 (*)    MCV 78.0 (*)    MCH 24.2 (*)    RDW 16.2 (*)    All other components within normal limits  RESP PANEL BY RT-PCR (FLU A&B, COVID) ARPGX2  ETHANOL  RAPID URINE DRUG SCREEN, HOSP PERFORMED  I-STAT BETA HCG BLOOD, ED (MC, [facility], AP ONLY)  CBG MONITORING, ED    EKG EKG Interpretation  Date/Time:  Sunday Aug 06 2021 06:49:07 EDT Ventricular Rate:  98 PR Interval:  128 QRS Duration: 82 QT Interval:  342 QTC Calculation: 436 R Axis:   69 Text Interpretation: Normal sinus rhythm with sinus arrhythmia Normal ECG twaves flattened from prior EKG When compared with ECG of 01-Jan-2021 04:00, PREVIOUS ECG IS PRESENT Confirmed by  Gwyneth Sprout ([id number]) on 08/06/2021 8:57:55 AM  Radiology No results found.  Procedures Procedures    Medications Ordered in ED Medications - No data to display  ED Course/ Medical Decision Making/ A&P Clinical Course as of 08/06/21 1550  Sun Aug 06, 2021  0725 Patient took trazodone. Will monitor for 6 hours due to rusk of cns, respiratory depression and potential seizures.  Will eval for co-ingestion.   [AH]  0924 CBC WITH DIFFERENTIAL(!) [AH]  1547 Salicylate level(!) [AH]  1547 Acetaminophen level(!) [AH]  1547 Comprehensive metabolic panel(!) [AH]  1547 Ethanol [AH]  1547 I-Stat beta hCG blood, ED [AH]  1548 Repeat EKG Repeat ekg shows no qt prolongation [AH]    Clinical Course User Index [AH] Arthor Captain, PA-C                           Medical Decision Making Patient is here for intentional overdose- Now medically clear for psych eval  Amount and/or Complexity of Data Reviewed Labs: ordered. Decision-making details documented in ED Course.            Final Clinical Impression(s) / ED Diagnoses Final diagnoses:  Intentional drug overdose, initial encounter [facility])  Suicide attempt [facility])    Rx / DC Orders ED Discharge Orders     None         Arthor Captain, PA-C 08/06/21 1550    Gwyneth Sprout, MD 08/10/21 [phone]

## 2021-08-06 NOTE — ED Notes (Signed)
Lab at bedside

## 2021-08-06 NOTE — Tx Team (Signed)
Initial Treatment Plan 08/06/2021 9:00 PM Rebecca Vaughan [id number]    PATIENT STRESSORS: Educational concerns   Financial difficulties   Legal issue   Substance abuse     PATIENT STRENGTHS: Ability for insight  Average or above average intelligence  Motivation for treatment/growth  Supportive family/friends  Work skills    PATIENT IDENTIFIED PROBLEMS: SI  Depression  School student at [employer]  Pending court date               DISCHARGE CRITERIA:  Improved stabilization in mood, thinking, and/or behavior  PRELIMINARY DISCHARGE PLAN: Return to previous living arrangement  PATIENT/FAMILY INVOLVEMENT: This treatment plan has been presented to and reviewed with the patient, Rebecca Vaughan, and/or family member.  The patient and family have been given the opportunity to ask questions and make suggestions.  Floyce Stakes, RN 08/06/2021, 9:00 PM

## 2021-08-06 NOTE — ED Notes (Signed)
Placed call to safe transport to transport patient to behavioral health hospital. Received no answer and left voicemail.

## 2021-08-06 NOTE — ED Notes (Signed)
Attempted IV start x 2. EDT attempted lab draw x 1. Notified phlebotomy

## 2021-08-06 NOTE — Progress Notes (Signed)
Psychoeducational Group Note  Date:  08/06/2021 Time:  2015  Group Topic/Focus:  Wrap up group  Participation Level: Did Not Attend  Participation Quality:  Not Applicable  Affect:  Not Applicable  Cognitive:  Not Applicable  Insight:  Not Applicable  Engagement in Group: Not Applicable  Additional Comments:  Did not attend.   Marcille Buffy 08/06/2021, 9:48 PM
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
## 2021-08-06 NOTE — ED Notes (Signed)
Patient discharged to [facility] without issue. Patient transported to [facility] by [employer]. Paperwork given to [employer] driver with no issue. BH personnel made aware of departure.

## 2021-08-06 NOTE — ED Triage Notes (Signed)
Pt reports taking 6 trazodone with intent to self harm roughly 1 hour ago. Pt endorses several episodes of vomiting since ingestion.  HX: SI

## 2021-08-06 NOTE — Progress Notes (Signed)
Patient is a 21 year old female admitted voluntarily after SI attempt where she took trazodone. She reports feeling overwhelmed with life currently. She apparently stole from her job and has a pending court date coming up soon. She reports having had several recent surgeries for endometriosis. She lives with her mother currently and is taking a break from [place]. She reports smoking THC occasionally, denies smoking tobacco products and drinks less than monthly. Skin assessment completed and old surgery scars on her abdomen are noted. Support givne and safety maintained with 15 min checks. Patient oriented to unit.

## 2021-08-06 NOTE — ED Notes (Signed)
Pt belongings were placed in locker 5 per previous shift staff member.

## 2021-08-06 NOTE — ED Notes (Signed)
Report given to on coming sitter

## 2021-08-06 NOTE — ED Notes (Signed)
Pt belongings inventoried and in in locker number #5, security called to wand pt but has not arrived yet.

## 2021-08-06 NOTE — Progress Notes (Signed)
[facility] LCSW Progress Note   08/06/2021    3:58 PM  Rebecca Vaughan   [id number]   Type of Contact and Topic:  Psychiatric Bed Placement   Pt accepted to [facility] 402-1 contingent on negative COVID test.   Patient meets inpatient criteria per Assunta Found, NP  The attending provider will be Phineas Inches, MD  Call report to [phone]    Amelia Jo, RN @ [facility] ED notified.     Signed:  Corky Crafts, MSW, LCSWA, LCAS 08/06/2021 4:00 PM

## 2021-08-07 ENCOUNTER — Encounter (HOSPITAL_COMMUNITY): Payer: Self-pay

## 2021-08-07 DIAGNOSIS — F322 Major depressive disorder, single episode, severe without psychotic features: Secondary | ICD-10-CM

## 2021-08-07 DIAGNOSIS — F430 Acute stress reaction: Secondary | ICD-10-CM | POA: Diagnosis present

## 2021-08-07 LAB — FOLATE: Folate: 7.8 ng/mL (ref >5.9)

## 2021-08-07 LAB — VITAMIN D 25 HYDROXY (VIT D DEFICIENCY, FRACTURES): Vit D, 25-Hydroxy: 14.88 ng/mL — ABNORMAL LOW (ref 30–100)

## 2021-08-07 LAB — TSH: TSH: 2.47 u[IU]/mL (ref 0.350–4.500)

## 2021-08-07 LAB — MAGNESIUM: Magnesium: 2.3 mg/dL (ref 1.7–2.4)

## 2021-08-07 LAB — VITAMIN B12: Vitamin B-12: 225 pg/mL (ref 180–914)

## 2021-08-07 MED ORDER — LORAZEPAM 1 MG PO TABS: 1.0000 mg | ORAL_TABLET | Freq: Four times a day (QID) | ORAL | Status: DC | PRN

## 2021-08-07 MED ORDER — WHITE PETROLATUM EX OINT
TOPICAL_OINTMENT | CUTANEOUS | Status: AC
  Filled 2021-08-07: qty 5

## 2021-08-07 MED ORDER — VENLAFAXINE HCL ER 37.5 MG PO CP24
37.5000 mg | ORAL_CAPSULE | Freq: Every day | ORAL | Status: DC
  Administered 2021-08-08 – 2021-08-09 (×2): 37.5 mg via ORAL
  Filled 2021-08-07 (×5): qty 1

## 2021-08-07 MED ORDER — TOPIRAMATE 25 MG PO TABS
25.0000 mg | ORAL_TABLET | Freq: Two times a day (BID) | ORAL | Status: DC
  Administered 2021-08-07 – 2021-08-12 (×10): 25 mg via ORAL
  Filled 2021-08-07 (×16): qty 1

## 2021-08-07 MED ORDER — LORAZEPAM 2 MG/ML IJ SOLN: 1.0000 mg | Freq: Four times a day (QID) | INTRAMUSCULAR | Status: DC | PRN

## 2021-08-07 NOTE — BH IP Treatment Plan (Signed)
Interdisciplinary Treatment and Diagnostic Plan Update  08/07/2021 Time of Session: 10:15am Rebecca Vaughan MRN: [id number]  Principal Diagnosis: MDD (major depressive disorder), severe (HCC)  Secondary Diagnoses: Principal Problem:   MDD (major depressive disorder), severe (HCC)   Current Medications:  Current Facility-Administered Medications  Medication Dose Route Frequency Provider Last Rate Last Admin   acetaminophen (TYLENOL) tablet 650 mg  650 mg Oral Q6H PRN Nwoko, Uchenna E, PA   650 mg at 08/06/21 2152   alum & mag hydroxide-simeth (MAALOX/MYLANTA) 200-200-20 MG/5ML suspension 15 mL  15 mL Oral Q4H PRN Nwoko, Uchenna E, PA       feeding supplement (ENSURE ENLIVE / ENSURE PLUS) liquid 237 mL  237 mL Oral BID BM Massengill, Harrold Donath, MD   237 mL at 08/07/21 0843   hydrOXYzine (ATARAX) tablet 25 mg  25 mg Oral TID PRN Nwoko, Uchenna E, PA   25 mg at 08/07/21 0844   magnesium hydroxide (MILK OF MAGNESIA) suspension 15 mL  15 mL Oral Daily PRN Nwoko, Uchenna E, PA       traZODone (DESYREL) tablet 50 mg  50 mg Oral QHS PRN Nwoko, Uchenna E, PA       PTA Medications: Medications Prior to Admission  Medication Sig Dispense Refill Last Dose   Elagolix Sodium (ORILISSA) 200 MG TABS Take 200 mg by mouth in the morning and at bedtime.   08/05/2021   escitalopram (LEXAPRO) 10 MG tablet Take 10 mg by mouth daily.   08/05/2021   ketorolac (TORADOL) 10 MG tablet Take 10 mg by mouth every 6 (six) hours as needed (For menstrual pain).   last month   LARIN FE 1/20 1-20 MG-MCG tablet Take 1 tablet by mouth daily.   08/05/2021   levothyroxine (SYNTHROID) 100 MCG tablet Take 100 mcg by mouth daily.   08/05/2021   traZODone (DESYREL) 50 MG tablet Take 50 mg by mouth at bedtime as needed for sleep.   08/06/2021    Patient Stressors: Educational concerns   Financial difficulties   Legal issue   Substance abuse    Patient Strengths: Ability for insight  Average or above average intelligence   Motivation for treatment/growth  Supportive family/friends  Work skills   Treatment Modalities: Medication Management, Group therapy, Case management,  1 to 1 session with clinician, Psychoeducation, Recreational therapy.   Physician Treatment Plan for Primary Diagnosis: MDD (major depressive disorder), severe (HCC) Long Term Goal(s):     Short Term Goals:    Medication Management: Evaluate patient's response, side effects, and tolerance of medication regimen.  Therapeutic Interventions: 1 to 1 sessions, Unit Group sessions and Medication administration.  Evaluation of Outcomes: Progressing  Physician Treatment Plan for Secondary Diagnosis: Principal Problem:   MDD (major depressive disorder), severe (HCC)  Long Term Goal(s):     Short Term Goals:       Medication Management: Evaluate patient's response, side effects, and tolerance of medication regimen.  Therapeutic Interventions: 1 to 1 sessions, Unit Group sessions and Medication administration.  Evaluation of Outcomes: Progressing   RN Treatment Plan for Primary Diagnosis: MDD (major depressive disorder), severe (HCC) Long Term Goal(s): Knowledge of disease and therapeutic regimen to maintain health will improve  Short Term Goals: Ability to remain free from injury will improve, Ability to verbalize frustration and anger appropriately will improve, Ability to demonstrate self-control, Ability to participate in decision making will improve, Ability to verbalize feelings will improve, Ability to disclose and discuss suicidal ideas, Ability to identify and develop  effective coping behaviors will improve, and Compliance with prescribed medications will improve  Medication Management: RN will administer medications as ordered by provider, will assess and evaluate patient's response and provide education to patient for prescribed medication. RN will report any adverse and/or side effects to prescribing provider.  Therapeutic  Interventions: 1 on 1 counseling sessions, Psychoeducation, Medication administration, Evaluate responses to treatment, Monitor vital signs and CBGs as ordered, Perform/monitor CIWA, COWS, AIMS and Fall Risk screenings as ordered, Perform wound care treatments as ordered.  Evaluation of Outcomes: Progressing   LCSW Treatment Plan for Primary Diagnosis: MDD (major depressive disorder), severe (HCC) Long Term Goal(s): Safe transition to appropriate next level of care at discharge, Engage patient in therapeutic group addressing interpersonal concerns.  Short Term Goals: Engage patient in aftercare planning with referrals and resources, Increase social support, Increase ability to appropriately verbalize feelings, Increase emotional regulation, Facilitate acceptance of mental health diagnosis and concerns, Facilitate patient progression through stages of change regarding substance use diagnoses and concerns, Identify triggers associated with mental health/substance abuse issues, and Increase skills for wellness and recovery  Therapeutic Interventions: Assess for all discharge needs, 1 to 1 time with Social worker, Explore available resources and support systems, Assess for adequacy in community support network, Educate family and significant other(s) on suicide prevention, Complete Psychosocial Assessment, Interpersonal group therapy.  Evaluation of Outcomes: Progressing   Progress in Treatment: Attending groups: Yes. Participating in groups: Yes. Taking medication as prescribed: Yes. Toleration medication: Yes. Family/Significant other contact made: Yes, individual(s) contacted:  Ray Church [phone] Mother  Patient understands diagnosis: Yes. Discussing patient identified problems/goals with staff: Yes. Medical problems stabilized or resolved: Yes. Denies suicidal/homicidal ideation: Yes. Issues/concerns per patient self-inventory: No. Other: none  New problem(s) identified: No,  Describe:  none reported   New Short Term/Long Term Goal(s):   medication stabilization, elimination of SI thoughts, development of comprehensive mental wellness plan.    Patient Goals:  Patient states, "I would like to work on my anxiety and panic attacks."  Discharge Plan or Barriers: Patient recently admitted. CSW will continue to follow and assess for appropriate referrals and possible discharge planning.    Reason for Continuation of Hospitalization: Anxiety Depression Medication stabilization Suicidal ideation  Estimated Length of Stay: 3-5 days  Last 3 [place] Suicide Severity Risk Score: Flowsheet Row [facility] (Current[facility] Most recent reading at 08/06/2021  8:40 PM ED from 08/06/2021 in [facility] EMERGENCY DEPARTMENT Most recent reading at 08/06/2021  6:48 AM ED to Hosp-Admission (Discharged) from 12/31/2020 in [facility] [place] [facility]  SURGICAL Most recent reading at 12/31/2020  5:19 PM  C-SSRS RISK CATEGORY Error: Question 1 not populated High Risk No Risk       Last PHQ 2/9 Scores:    08/06/2021    1:38 PM  Depression screen PHQ 2/9  Decreased Interest 3  Down, Depressed, Hopeless 3  PHQ - 2 Score 6  Altered sleeping 1  Tired, decreased energy 0  Change in appetite 0  Feeling bad or failure about yourself  3  Trouble concentrating 0  Moving slowly or fidgety/restless 0  Suicidal thoughts 1  PHQ-9 Score 11  Difficult doing work/chores Somewhat difficult    Scribe for Treatment Team: Beatris Si, LCSW 08/07/2021 11:41 AM

## 2021-08-07 NOTE — Group Note (Signed)
LCSW Group Therapy Note   Group Date: 08/07/2021 Start Time: 1300 End Time: 1400  Type of Therapy and Topic:  Group Therapy: Thoughts, Feelings, and Actions  Participation Level:  Active   Description of Group:   In this group, each patient discussed their previous experiencing and understanding of overthinking, identifying the harmful impact on their lives. As a group, each patient was introduced to the basic concepts of Cognitive Behavioral Therapy: that thoughts, feelings, and actions are all connected and influence one another. They were given examples of how overthinking can affect our feelings, actions, and vise versa. The group was then asked to analyze how overthinking was harmful and brainstorm alternative thinking patterns/reactions to the example situation. Then, each group member filled out and identified their own example situation in which a problem situation caused their thoughts, feelings, and actions to be negatively impacted; they were asked to come up with 3 new (more adaptive/positive) thoughts that led to 3 new feelings and actions.  Therapeutic Goals: Patients will review and discuss their past experience with overthinking. Patients will learn the basics of the CBT model through group-led examples.. Patients will identify situations where they may have negative thoughts, feelings, or actions and will then reframe the situation using more positive thoughts to react differently.  Summary of Patient Progress:  The patient shared that they felt stressed today. Patient contributed to the discussion of how thoughts, feelings, and actions interact, noting when they may have experienced a negative thought pattern and recognized it as harmful. They were attentive when other patients shared their experiences, and worked to reframe their own thoughts in an activity to identify future situations where they may typically overthink.  Therapeutic Modalities:   Cognitive Behavioral  Therapy Mindfulness  Rebecca Vaughan, [place] 08/07/2021  2:09 PM

## 2021-08-07 NOTE — Progress Notes (Signed)
Patient presents with depressed mood, affect congruent. Rebecca Vaughan reports she is feeling pretty depressed. Patient states '' just really learning to deal with my anxiety. And everything. '' She states she is normally taking lexapro to help with her mood. Patient reports she '' was admitted due to taking too many pills in an attempt to kill herself , but no longer feels like dying. '' Pt reports continued negative thoughts and '' just negative thoughts that I dont like that make me feel more anxious'' Patient denies any other acute concerns. She was offered prn vistaril for anxiety, which she accepted. Pt has been quiet but visible on the unit. She completed self inventory and rates her depression and hopeless ness and anxiety at a 5/10 on scale, 10 being worst, 0 being none on scale. She reports her goal is to keep her anxiety under control. And to focus on positive thoughts. Pt is safe, attending programming. Will con't to monitor.

## 2021-08-07 NOTE — H&P (Signed)
Psychiatric Admission Assessment Adult  Patient Identification: Rebecca Vaughan MRN:  [id number] Date of Evaluation:  08/07/2021 Chief Complaint:  MDD (major depressive disorder), severe ([place]) [F32.2] Principal Diagnosis: MDD (major depressive disorder), severe ([place]) Diagnosis:  Principal Problem:   MDD (major depressive disorder), severe ([place]) Active Problems:   Anxiety   Acute stress reaction  History of Present Illness: Rebecca Vaughan is a 21 YO F with a history of depression and anxiety who presents s/p intentional overdose on trazodone following a series of financial, family, employment and legal stressors.              On interview, the patient explains a 2 year decline. She has been a good student from a family with several police officers in it. When her parent got into financial trouble, she stole from work to help out. She ended up with some legal trouble that is now coming to pass. She has been having increased anxiety and panic attacks. She felt responsible for this because she had medical issues from chronic pain that resulted in surgery bills contributing. She is having a hard time dealing with the guilt and shame from all of it. She is not sleeping or eating very well. She feels tense all of the time. She was started on lexapro and trazodone for sleep. She has been started with therapy already. Her energy is low. No hallucinations, paranoia or delusions. She rarely drinks alcohol and has used cannabis for her chronic pain. Associated Signs/Symptoms: Depression Symptoms:  depressed mood, anhedonia, insomnia, fatigue, feelings of worthlessness/guilt, suicidal attempt, anxiety, panic attacks, loss of energy/fatigue, Duration of Depression Symptoms: Less than two weeks  (Hypo) Manic Symptoms:  Impulsivity, Anxiety Symptoms:  Panic Symptoms, Psychotic Symptoms:   na PTSD Symptoms: NA Total Time spent with patient: 45 minutes  Past Psychiatric History: depression. Prescribed  lexapro and trazodone. Seeing therapist at [facility] psychiatric. Remote history of cutting behaviors but no prior suicide attempts. No previous inpatient treatments.   Is the patient at risk to self? Yes.    Has the patient been a risk to self in the past 6 months? No.  Has the patient been a risk to self within the distant past? No.  Is the patient a risk to others? No.  Has the patient been a risk to others in the past 6 months? No.  Has the patient been a risk to others within the distant past? No.   Prior Inpatient Therapy:   Prior Outpatient Therapy:    Alcohol Screening: Patient refused Alcohol Screening Tool: Yes 1. How often do you have a drink containing alcohol?: Monthly or less 2. How many drinks containing alcohol do you have on a typical day when you are drinking?: 1 or 2 3. How often do you have six or more drinks on one occasion?: Less than monthly AUDIT-C Score: 2 Substance Abuse History in the last 12 months:  Yes.   Consequences of Substance Abuse: Negative Previous Psychotropic Medications: Yes  Psychological Evaluations: Yes  Past Medical History:  Past Medical History:  Diagnosis Date   Anxiety    Constipation    COVID 10/2019   fever chills headache x 5 days all symptoms resolved   Depression    Endometriosis    Hypothyroidism    Pneumonia as child age 53   right lung   Thyroid disease    hyperthyroid   Wears glasses     Past Surgical History:  Procedure Laterality Date   LAPAROTOMY N/A 05/06/2020  Procedure: EXPLORATORY LAPAROTOMY, LYSIS OF ADHESIONS, DRAINAGE OF ENDOMETRIOMAS, PARTIAL RIGHT OVARIAN CYSTECTOMY;  Surgeon: Dian Queen, MD;  Location: [facility];  Service: Gynecology;  Laterality: N/A;   Family History:  Family History  Problem Relation Age of Onset   Other Mother    Thyroid disease Mother    Hypertension Father    Diabetes Father    Hypercholesterolemia Father    Hypertension Maternal Grandmother     Hypertension Paternal Grandmother    Diabetes Paternal Grandmother    Hypertension Paternal Grandfather    Diabetes Paternal Grandfather    Family Psychiatric  History: maternal aunt with anxiety and substance use. Maternal aunt with bipolar. Cousin with suicide and multiple personality. Uncles with PTSD. Mother with anxiety and depression Tobacco Screening:  negative Social History:  Social History   Substance and Sexual Activity  Alcohol Use Not Currently     Social History   Substance and Sexual Activity  Drug Use Not Currently    Additional Social History: Marital status: Single Are you sexually active?: Yes What is your sexual orientation?: Bisexual Has your sexual activity been affected by drugs, alcohol, medication, or emotional stress?: No Does patient have children?: No                         Allergies:   Allergies  Allergen Reactions   Kiwi Extract Other (See Comments)    Lips get black scabs   Lab Results:  Results for orders placed or performed during the hospital encounter of 08/06/21 (from the past 48 hour(s))  Comprehensive metabolic panel     Status: Abnormal   Collection Time: 08/06/21  8:59 AM  Result Value Ref Range   Sodium 135 135 - 145 mmol/L   Potassium 3.8 3.5 - 5.1 mmol/L   Chloride 107 98 - 111 mmol/L   CO2 21 (L) 22 - 32 mmol/L   Glucose, Bld 99 70 - 99 mg/dL    Comment: Glucose reference range applies only to samples taken after fasting for at least 8 hours.   BUN 7 6 - 20 mg/dL   Creatinine, Ser 0.72 0.44 - 1.00 mg/dL   Calcium 9.4 8.9 - 10.3 mg/dL   Total Protein 7.5 6.5 - 8.1 g/dL   Albumin 3.7 3.5 - 5.0 g/dL   AST 17 15 - 41 U/L   ALT 14 0 - 44 U/L   Alkaline Phosphatase 66 38 - 126 U/L   Total Bilirubin 0.6 0.3 - 1.2 mg/dL   GFR, Estimated >60 >60 mL/min    Comment: (NOTE) Calculated using the CKD-EPI Creatinine Equation (2021)    Anion gap 7 5 - 15    Comment: Performed at [facility] [address],  [place], [place] [id number]  Salicylate level     Status: Abnormal   Collection Time: 08/06/21  8:59 AM  Result Value Ref Range   Salicylate Lvl [id number] (L) 7.0 - 30.0 mg/dL    Comment: Performed at [facility] [address], [place], [place] [address]  Acetaminophen level     Status: Abnormal   Collection Time: 08/06/21  8:59 AM  Result Value Ref Range   Acetaminophen (Tylenol), Serum <10 (L) 10 - 30 ug/mL    Comment: (NOTE) Therapeutic concentrations vary significantly. A range of 10-30 ug/mL  may be an effective concentration for many patients. However, some  are best treated at concentrations outside of this range. Acetaminophen concentrations >150  ug/mL at 4 hours after ingestion  and >50 ug/mL at 12 hours after ingestion are often associated with  toxic reactions.  Performed at [facility], [place] [address], [place], [place] [address]   Ethanol     Status: None   Collection Time: 08/06/21  8:59 AM  Result Value Ref Range   Alcohol, Ethyl (B) <10 <10 mg/dL    Comment: (NOTE) Lowest detectable limit for serum alcohol is 10 mg/dL.  For medical purposes only. Performed at [facility], [place] [address], [place], [place] [address]   CBC WITH DIFFERENTIAL     Status: Abnormal   Collection Time: 08/06/21  8:59 AM  Result Value Ref Range   WBC 8.5 4.0 - 10.5 K/uL   RBC 5.45 (H) 3.87 - 5.11 MIL/uL   Hemoglobin 13.2 12.0 - 15.0 g/dL   HCT 42.5 36.0 - 46.0 %   MCV 78.0 (L) 80.0 - 100.0 fL   MCH 24.2 (L) 26.0 - 34.0 pg   MCHC 31.1 30.0 - 36.0 g/dL   RDW 16.2 (H) 11.5 - 15.5 %   Platelets 325 150 - 400 K/uL   nRBC 0.0 0.0 - 0.2 %   Neutrophils Relative % 63 %   Neutro Abs 5.3 1.7 - 7.7 K/uL   Lymphocytes Relative 29 %   Lymphs Abs 2.5 0.7 - 4.0 K/uL   Monocytes Relative 7 %   Monocytes Absolute 0.6 0.1 - 1.0 K/uL   Eosinophils Relative 1 %   Eosinophils Absolute 0.0 0.0 - 0.5 K/uL   Basophils Relative 0 %   Basophils Absolute 0.0 0.0 - 0.1 K/uL   Immature  Granulocytes 0 %   Abs Immature Granulocytes 0.03 0.00 - 0.07 K/uL    Comment: Performed at [facility], [address], [place], [place] [address]  I-Stat beta hCG blood, ED     Status: None   Collection Time: 08/06/21  9:14 AM  Result Value Ref Range   I-stat hCG, quantitative <5.0 <5 mIU/mL   Comment 3            Comment:   GEST. AGE      CONC.  (mIU/mL)   <=1 WEEK        5 - 50     2 WEEKS       50 - 500     3 WEEKS       100 - 10,000     4 WEEKS     1,000 - 30,000        FEMALE AND NON-PREGNANT FEMALE:     LESS THAN 5 mIU/mL   Resp Panel by RT-PCR (Flu A&B, Covid) Nasopharyngeal Swab     Status: None   Collection Time: 08/06/21  3:19 PM   Specimen: Nasopharyngeal Swab; Nasopharyngeal(NP) swabs in vial transport medium  Result Value Ref Range   SARS Coronavirus 2 by RT PCR NEGATIVE NEGATIVE    Comment: (NOTE) SARS-CoV-2 target nucleic acids are NOT DETECTED.  The SARS-CoV-2 RNA is generally detectable in upper respiratory specimens during the acute phase of infection. The lowest concentration of SARS-CoV-2 viral copies this assay can detect is 138 copies/mL. A negative result does not preclude SARS-Cov-2 infection and should not be used as the sole basis for treatment or other patient management decisions. A negative result may occur with  improper specimen collection/handling, submission of specimen other than nasopharyngeal swab, presence of viral mutation(s) within the areas targeted by this assay, and  inadequate number of viral copies(<138 copies/mL). A negative result must be combined with clinical observations, patient history, and epidemiological information. The expected result is Negative.  Fact Sheet for Patients:  EntrepreneurPulse.com.au  Fact Sheet for Healthcare Providers:  IncredibleEmployment.be  This test is no t yet approved or cleared by the [place] FDA and  has been authorized for detection and/or  diagnosis of SARS-CoV-2 by FDA under an Emergency Use Authorization (EUA). This EUA will remain  in effect (meaning this test can be used) for the duration of the COVID-19 declaration under Section 564(b)(1) of the Act, 21 U.S.C.section 360bbb-3(b)(1), unless the authorization is terminated  or revoked sooner.       Influenza A by PCR NEGATIVE NEGATIVE   Influenza B by PCR NEGATIVE NEGATIVE    Comment: (NOTE) The Xpert Xpress SARS-CoV-2/FLU/RSV plus assay is intended as an aid in the diagnosis of influenza from Nasopharyngeal swab specimens and should not be used as a sole basis for treatment. Nasal washings and aspirates are unacceptable for Xpert Xpress SARS-CoV-2/FLU/RSV testing.  Fact Sheet for Patients: EntrepreneurPulse.com.au  Fact Sheet for Healthcare Providers: IncredibleEmployment.be  This test is not yet approved or cleared by the [place] FDA and has been authorized for detection and/or diagnosis of SARS-CoV-2 by FDA under an Emergency Use Authorization (EUA). This EUA will remain in effect (meaning this test can be used) for the duration of the COVID-19 declaration under Section 564(b)(1) of the Act, 21 U.S.C. section 360bbb-3(b)(1), unless the authorization is terminated or revoked.  Performed at [facility], [place] [address] [place], [place] [address]   Urine rapid drug screen (hosp performed)     Status: None   Collection Time: 08/06/21  6:24 PM  Result Value Ref Range   Opiates NONE DETECTED NONE DETECTED   Cocaine NONE DETECTED NONE DETECTED   Benzodiazepines NONE DETECTED NONE DETECTED   Amphetamines NONE DETECTED NONE DETECTED   Tetrahydrocannabinol NONE DETECTED NONE DETECTED   Barbiturates NONE DETECTED NONE DETECTED    Comment: (NOTE) DRUG SCREEN FOR MEDICAL PURPOSES ONLY.  IF CONFIRMATION IS NEEDED FOR ANY PURPOSE, NOTIFY LAB WITHIN 5 DAYS.  LOWEST DETECTABLE LIMITS FOR URINE DRUG SCREEN Drug  Class                     Cutoff (ng/mL) Amphetamine and metabolites    1000 Barbiturate and metabolites    200 Benzodiazepine                 [id number] Tricyclics and metabolites     300 Opiates and metabolites        300 Cocaine and metabolites        300 THC                            50 Performed at [facility], [place] [address] [place], [place] [address]     Blood Alcohol level:  Lab Results  Component Value Date   ETH <10 08/06/2021   ETH 175 (H) 99991111    Metabolic Disorder Labs:  Lab Results  Component Value Date   HGBA1C 6.0 (H) 12/16/2014   MPG 126 (H) 12/16/2014   No results found for: PROLACTIN No results found for: CHOL, TRIG, HDL, CHOLHDL, VLDL, LDLCALC  Current Medications: Current Facility-Administered Medications  Medication Dose Route Frequency Provider Last Rate Last Admin   acetaminophen (TYLENOL) tablet 650 mg  650 mg Oral Q6H  PRN Malachy Mood, PA   650 mg at 08/06/21 2152   alum & mag hydroxide-simeth (MAALOX/MYLANTA) 200-200-20 MG/5ML suspension 15 mL  15 mL Oral Q4H PRN Nwoko, Uchenna E, PA       feeding supplement (ENSURE ENLIVE / ENSURE PLUS) liquid 237 mL  237 mL Oral BID BM Massengill, Ovid Curd, MD   237 mL at 08/07/21 1602   hydrOXYzine (ATARAX) tablet 25 mg  25 mg Oral TID PRN Nwoko, Uchenna E, PA   25 mg at 08/07/21 0844   LORazepam (ATIVAN) tablet 1 mg  1 mg Oral Q6H PRN Maida Sale, MD       Or   LORazepam (ATIVAN) injection 1 mg  1 mg Intramuscular Q6H PRN Brysyn Brandenberger, Jackie Plum, MD       magnesium hydroxide (MILK OF MAGNESIA) suspension 15 mL  15 mL Oral Daily PRN Nwoko, Uchenna E, PA       topiramate (TOPAMAX) tablet 25 mg  25 mg Oral BID Maida Sale, MD   25 mg at 08/07/21 1621   traZODone (DESYREL) tablet 50 mg  50 mg Oral QHS PRN Nwoko, Terese Door, PA       [START ON 08/08/2021] venlafaxine XR (EFFEXOR-XR) 24 hr capsule 37.5 mg  37.5 mg Oral Q breakfast Osha Rane, Jackie Plum, MD       white petrolatum (VASELINE)  gel            PTA Medications: Medications Prior to Admission  Medication Sig Dispense Refill Last Dose   Elagolix Sodium (ORILISSA) 200 MG TABS Take 200 mg by mouth in the morning and at bedtime.   08/05/2021   escitalopram (LEXAPRO) 10 MG tablet Take 10 mg by mouth daily.   08/05/2021   ketorolac (TORADOL) 10 MG tablet Take 10 mg by mouth every 6 (six) hours as needed (For menstrual pain).   last month   LARIN FE 1/20 1-20 MG-MCG tablet Take 1 tablet by mouth daily.   08/05/2021   levothyroxine (SYNTHROID) 100 MCG tablet Take 100 mcg by mouth daily.   08/05/2021   traZODone (DESYREL) 50 MG tablet Take 50 mg by mouth at bedtime as needed for sleep.   08/06/2021    Musculoskeletal: Strength & Muscle Tone: within normal limits Gait & Station: normal Patient leans: N/A            Psychiatric Specialty Exam:  Presentation  General Appearance: Appropriate for Environment  Eye Contact:Good  Speech:Normal Rate  Speech Volume:Normal  Handedness:No data recorded  Mood and Affect  Mood:Anxious; Depressed  Affect:Depressed   Thought Process  Thought Processes:Linear  Duration of Psychotic Symptoms: No data recorded Past Diagnosis of Schizophrenia or Psychoactive disorder: No  Descriptions of Associations:Intact  Orientation:Full (Time, Place and Person)  Thought Content:Logical  Hallucinations:Hallucinations: None  Ideas of Reference:None  Suicidal Thoughts:Suicidal Thoughts: No  Homicidal Thoughts:Homicidal Thoughts: No   Sensorium  Memory:Immediate Good; Recent Good; Remote Fair  Judgment:Poor  Insight:Poor   Executive Functions  Concentration:Fair  Attention Span:Fair  [place] of Knowledge:Good  Language:Good   Psychomotor Activity  Psychomotor Activity:Psychomotor Activity: Normal   Assets  Assets:Communication Skills; Vocational/Educational   Sleep  Sleep:Sleep: Fair    Physical Exam: Physical Exam ROS Blood  pressure (!) 131/93, pulse (!) 109, temperature 98.2 F (36.8 C), temperature source Oral, resp. rate 16, height 5\' 4"  (1.626 m), weight 70.3 kg, last menstrual period 07/08/2021, SpO2 100 %. Body mass index is 26.61 kg/m.  Treatment Plan Summary: Daily contact  with patient to assess and evaluate symptoms and progress in treatment and Medication management Safety and Monitoring --  Admission to inpatient psychiatric unit for safety, stabilization and treatment -- Daily contact with patient to assess and evaluate symptoms and progress in treatment -- Patient's case to be discussed in multi-disciplinary team meeting. -- Patient will be encouraged to participate in the therapeutic group milieu. -- Observation Level : q15 minute checks -- Vital signs:  q12 hours -- Precautions: suicide.  Plan  -Monitor Vitals. -Monitor for thoughts of harm to self or others -Monitor for psychosis, disorganization or changes to cognition -Monitor for withdrawal symptoms. -Monitor for medication side effects.  Observation Level/Precautions:  15 minute checks  Laboratory:   A1c, nutrition, TSH  Psychotherapy:  milieu, 1:1  Medications:  Topamax for chronic pain, effexor for the mood and anxiety   Consultations:  na  Discharge Concerns:  na  Estimated LOS: 3-5  Other:     Physician Treatment Plan for Primary Diagnosis: MDD (major depressive disorder), severe ([place]) Long Term Goal(s): Improvement in symptoms so as ready for discharge  Short Term Goals: Ability to identify changes in lifestyle to reduce recurrence of condition will improve, Ability to verbalize feelings will improve, Ability to disclose and discuss suicidal ideas, Ability to demonstrate self-control will improve, Ability to identify and develop effective coping behaviors will improve, Ability to maintain clinical measurements within normal limits will improve, and Compliance with prescribed medications will improve  Physician Treatment Plan  for Secondary Diagnosis: Principal Problem:   MDD (major depressive disorder), severe ([place]) Active Problems:   Anxiety   Acute stress reaction  Long Term Goal(s): Improvement in symptoms so as ready for discharge  Short Term Goals: Ability to identify changes in lifestyle to reduce recurrence of condition will improve, Ability to verbalize feelings will improve, Ability to disclose and discuss suicidal ideas, Ability to demonstrate self-control will improve, Ability to identify and develop effective coping behaviors will improve, Ability to maintain clinical measurements within normal limits will improve, and Compliance with prescribed medications will improve  I certify that inpatient services furnished can reasonably be expected to improve the patient's condition.    Maida Sale, MD 5/22/20235:49 PM

## 2021-08-07 NOTE — Group Note (Signed)
Recreation Therapy Group Note   Group Topic:Stress Management  Group Date: 08/07/2021 Start Time: 0940 End Time: 0953 Facilitators: Caroll Rancher, LRT,CTRS Location: [address] Dayroom   Goal Area(s) Addresses:  Patient will actively participate in stress management techniques presented during session.  Patient will successfully identify benefit of practicing stress management post d/c.   Group Description: Guided Imagery. LRT provided education, instruction, and demonstration on practice of visualization via guided imagery. Patient was asked to participate in the technique introduced during session. LRT debriefed including topics of mindfulness, stress management and specific scenarios each patient could use these techniques. Patients were given suggestions of ways to access scripts post d/c and encouraged to explore Youtube and other apps available on smartphones, tablets, and computers.   Affect/Mood: N/A   Participation Level: Did not attend    Clinical Observations/Individualized Feedback:     Plan: Continue to engage patient in RT group sessions 2-3x/week.   Caroll Rancher, LRT,CTRS 08/07/2021 11:34 AM
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
## 2021-08-07 NOTE — B[facility] Suicide Risk Assessment (Signed)
[facility] Admission Suicide Risk Assessment   Nursing information obtained from:  Patient Demographic factors:  Adolescent or young adult, Unemployed Current Mental Status:  Suicidal ideation indicated by patient, Self-harm thoughts Loss Factors:  Financial problems / change in socioeconomic status, Legal issues Historical Factors:  Family history of suicide, Family history of mental illness or substance abuse Risk Reduction Factors:  Positive social support, Positive therapeutic relationship, Living with another person, especially a relative, Positive coping skills or problem solving skills  Total Time spent with patient: 45 minutes Principal Problem: MDD (major depressive disorder), severe (HCC) Diagnosis:  Principal Problem:   MDD (major depressive disorder), severe (HCC) Active Problems:   Anxiety  Subjective Data: Jadore is a 21 YO F with a history of depression and anxiety who presents s/p intentional overdose on trazodone following a series of financial, family, employment and legal stressors.   On interview, the patient explains a 2 year decline. She has been a good student from a family with several police officers in it. When her parent got into financial trouble, she stole from work to help out. She ended up with some legal trouble that is now coming to pass. She has been having increased anxiety and panic attacks. She felt responsible for this because she had medical issues from chronic pain that resulted in surgery bills contributing. She is having a hard time dealing with the guilt and shame from all of it. She is not sleeping or eating very well. She feels tense all of the time. She was started on lexapro and trazodone for sleep. She has been started with therapy already. Her energy is low. No hallucinations, paranoia or delusions. She rarely drinks alcohol and has used cannabis for her chronic pain.   Continued Clinical Symptoms:    The "Alcohol Use Disorders Identification Test",  Guidelines for Use in Primary Care, Second Edition.  World Science writer [facility]). Score between 0-7:  no or low risk or alcohol related problems. Score between 8-15:  moderate risk of alcohol related problems. Score between 16-19:  high risk of alcohol related problems. Score 20 or above:  warrants further diagnostic evaluation for alcohol dependence and treatment.   CLINICAL FACTORS:   Severe Anxiety and/or Agitation Panic Attacks Depression:   Insomnia Chronic Pain   Musculoskeletal: Strength & Muscle Tone: within normal limits Gait & Station: normal Patient leans: N/A  Psychiatric Specialty Exam:  Presentation  General Appearance: Appropriate for Environment  Eye Contact:Good  Speech:Normal Rate  Speech Volume:Normal  Handedness:No data recorded  Mood and Affect  Mood:Anxious; Depressed  Affect:Depressed   Thought Process  Thought Processes:Linear  Descriptions of Associations:Intact  Orientation:Full (Time, Place and Person)  Thought Content:Logical  History of Schizophrenia/Schizoaffective disorder:No  Duration of Psychotic Symptoms:No data recorded Hallucinations:Hallucinations: None  Ideas of Reference:None  Suicidal Thoughts:Suicidal Thoughts: No  Homicidal Thoughts:Homicidal Thoughts: No   Sensorium  Memory:Immediate Good; Recent Good; Remote Fair  Judgment:Poor  Insight:Poor   Executive Functions  Concentration:Fair  Attention Span:Fair  Recall:Good  Fund of Knowledge:Good  Language:Good   Psychomotor Activity  Psychomotor Activity:Psychomotor Activity: Normal   Assets  Assets:Communication Skills; Vocational/Educational   Sleep  Sleep:Sleep: Fair    Physical Exam: Physical Exam Vitals and nursing note reviewed.  HENT:     Head: Normocephalic.     Nose: Nose normal.  Eyes:     Extraocular Movements: Extraocular movements intact.  Pulmonary:     Effort: Pulmonary effort is normal.  Musculoskeletal:  General: Normal range of motion.     Cervical back: Normal range of motion.  Neurological:     General: No focal deficit present.     Mental Status: She is alert and oriented to person, place, and time.  Psychiatric:        Attention and Perception: Attention normal.        Mood and Affect: Mood is anxious and depressed.        Speech: Speech normal.        Behavior: Behavior normal.        Thought Content: Thought content normal.        Cognition and Memory: Cognition and memory normal.        Judgment: Judgment is impulsive.   Review of Systems  Constitutional:  Negative for chills and fever.  HENT:  Negative for hearing loss.   Eyes:  Negative for blurred vision.  Respiratory:  Negative for cough.   Cardiovascular:  Negative for chest pain.  Gastrointestinal:  Negative for constipation, diarrhea and vomiting.  Genitourinary:  Negative for dysuria and urgency.  Musculoskeletal:  Negative for myalgias.  Skin:  Negative for rash.  Neurological:  Positive for headaches. Negative for dizziness and tremors.  Psychiatric/Behavioral:  Positive for depression. Negative for hallucinations. The patient has insomnia. The patient is not nervous/anxious.   Blood pressure (!) 131/93, pulse (!) 109, temperature 98.2 F (36.8 C), temperature source Oral, resp. rate 16, height 5\' 4"  (1.626 m), weight 70.3 kg, last menstrual period 07/08/2021, SpO2 100 %. Body mass index is 26.61 kg/m.   COGNITIVE FEATURES THAT CONTRIBUTE TO RISK:  None    SUICIDE RISK:   Moderate:  Frequent suicidal ideation with limited intensity, and duration, some specificity in terms of plans, no associated intent, good self-control, limited dysphoria/symptomatology, some risk factors present, and identifiable protective factors, including available and accessible social support.  PLAN OF CARE:   Safety and Monitoring --  Admission to inpatient psychiatric unit for safety, stabilization and treatment -- Daily  contact with patient to assess and evaluate symptoms and progress in treatment -- Patient's case to be discussed in multi-disciplinary team meeting. -- Patient will be encouraged to participate in the therapeutic group milieu. -- Observation Level : q15 minute checks -- Vital signs:  q12 hours -- Precautions: suicide.  Plan  -Monitor Vitals. -Monitor for thoughts of harm to self or others -Monitor for psychosis, disorganization or changes to cognition -Monitor for withdrawal symptoms. -Monitor for medication side effects.  Labs/Studies: A1c, nutrition, TSH  Medications: Topamax for chronic pain, effexor for the mood and anxiety   I certify that inpatient services furnished can reasonably be expected to improve the patient's condition.   07/10/2021, MD 08/07/2021, 5:40 PM

## 2021-08-07 NOTE — BHH Group Notes (Signed)
Pt attended AA group 

## 2021-08-07 NOTE — Group Note (Signed)
Occupational Therapy Group Note  Group Topic:Communication  Group Date: 08/07/2021 Start Time: 1400 End Time: 1500 Facilitators: Brantley Stage, OT   Group Description: Group encouraged increased engagement and participation through discussion focused on communication styles. Patients were educated on the different styles of communication including passive, aggressive, assertive, and passive-aggressive communication. Group members shared and reflected on which styles they most often find themselves communicating in and brainstormed strategies on how to transition and practice a more assertive approach. Further discussion explored how to use assertiveness skills and strategies to further advocate and ask questions as it relates to their treatment plan and mental health.   Therapeutic Goal(s): Identify practical strategies to improve communication skills  Identify how to use assertive communication skills to address individual needs and wants   Participation Level: Active and Engaged   Participation Quality: Independent   Behavior: Alert and Appropriate   Speech/Thought Process: Coherent and Directed   Affect/Mood: Appropriate   Insight: Fair   Judgement: Fair   Individualization: Pt was active in their participation of group discussion/activity. New skills were identified  Modes of Intervention: Discussion and Education  Patient Response to Interventions:  Attentive, Engaged, and Interested    Plan: Continue to engage patient in OT groups 2 - 3x/week.  08/07/2021  Brantley Stage, OT  Cornell Barman, OT

## 2021-08-07 NOTE — Progress Notes (Signed)
Patient did not attend morning orientation group.  

## 2021-08-08 LAB — HEMOGLOBIN A1C
Hgb A1c MFr Bld: 5.6 % (ref 4.8–5.6)
Mean Plasma Glucose: 114.02 mg/dL

## 2021-08-08 MED ORDER — LORATADINE 10 MG PO TABS
10.0000 mg | ORAL_TABLET | Freq: Every day | ORAL | Status: DC
  Administered 2021-08-08 – 2021-08-12 (×2): 10 mg via ORAL
  Filled 2021-08-08 (×8): qty 1

## 2021-08-08 MED ORDER — ELAGOLIX SODIUM 200 MG PO TABS
200.0000 mg | ORAL_TABLET | Freq: Two times a day (BID) | ORAL | Status: DC
  Administered 2021-08-09 – 2021-08-12 (×7): 200 mg via ORAL

## 2021-08-08 MED ORDER — NORETHIN ACE-ETH ESTRAD-FE 1-20 MG-MCG PO TABS
1.0000 | ORAL_TABLET | Freq: Every day | ORAL | Status: DC
  Administered 2021-08-08 – 2021-08-11 (×4): 1 via ORAL

## 2021-08-08 MED ORDER — IBUPROFEN 600 MG PO TABS
600.0000 mg | ORAL_TABLET | Freq: Four times a day (QID) | ORAL | Status: DC | PRN
  Administered 2021-08-08 – 2021-08-12 (×6): 600 mg via ORAL
  Filled 2021-08-08 (×6): qty 1

## 2021-08-08 MED ORDER — VITAMIN D (ERGOCALCIFEROL) 1.25 MG (50000 UNIT) PO CAPS
50000.0000 [IU] | ORAL_CAPSULE | ORAL | Status: DC
  Administered 2021-08-08: 50000 [IU] via ORAL
  Filled 2021-08-08 (×2): qty 1

## 2021-08-08 MED ORDER — MELATONIN 5 MG PO TABS
5.0000 mg | ORAL_TABLET | Freq: Every evening | ORAL | Status: DC | PRN
  Administered 2021-08-10: 5 mg via ORAL
  Filled 2021-08-08: qty 1

## 2021-08-08 MED ORDER — LEVOTHYROXINE SODIUM 100 MCG PO TABS
100.0000 ug | ORAL_TABLET | Freq: Every day | ORAL | Status: DC
  Administered 2021-08-09 – 2021-08-12 (×4): 100 ug via ORAL
  Filled 2021-08-08 (×7): qty 1

## 2021-08-08 MED ORDER — NICOTINE 14 MG/24HR TD PT24
14.0000 mg | MEDICATED_PATCH | Freq: Every day | TRANSDERMAL | Status: DC
  Administered 2021-08-08: 14 mg via TRANSDERMAL
  Filled 2021-08-08 (×5): qty 1

## 2021-08-08 NOTE — Progress Notes (Signed)
   08/08/21 1424  Psych Admission Type (Psych Patients Only)  Admission Status Voluntary  Psychosocial Assessment  Patient Complaints Irritability  Eye Contact Fair  Facial Expression Flat  Affect Irritable  Speech Logical/coherent  Interaction Assertive  Motor Activity Other (Comment) (WNL)  Appearance/Hygiene Unremarkable  Behavior Characteristics Cooperative  Mood Euthymic  Thought Process  Coherency WDL  Content WDL  Delusions None reported or observed  Perception WDL  Hallucination None reported or observed  Judgment WDL  Confusion None  Danger to Self  Current suicidal ideation? Denies  Agreement Not to Harm Self Yes  Description of Agreement verbal  Danger to Others  Danger to Others None reported or observed

## 2021-08-08 NOTE — Progress Notes (Addendum)
  On assessment, pt is observed while using the phone.  Pt endorses mild anxiety and denies depression.  Pt denies SI/HI and verbally contracts for safety.  Pt denies AVH.  Administered PRN Hydroxyzine for anxiety, and Advil for abdominal pain related to cramping.  Pt is safe on the unit with Q 15 minute safety check.   08/08/21 2142  Psych Admission Type (Psych Patients Only)  Admission Status Voluntary  Psychosocial Assessment  Patient Complaints Anxiety  Eye Contact Fair  Facial Expression Flat  Affect Anxious  Speech Logical/coherent  Interaction Assertive  Motor Activity Other (Comment) (WNL)  Appearance/Hygiene Unremarkable  Behavior Characteristics Cooperative  Mood Anxious  Thought Process  Coherency WDL  Content WDL  Delusions None reported or observed  Perception WDL  Hallucination None reported or observed  Judgment Poor  Confusion WDL  Danger to Self  Current suicidal ideation? Denies  Self-Injurious Behavior No self-injurious ideation or behavior indicators observed or expressed   Agreement Not to Harm Self Yes  Description of Agreement verbal contract for safety  Danger to Others  Danger to Others None reported or observed

## 2021-08-08 NOTE — BHH Group Notes (Signed)
Adult Orientation Group Note  Date:  08/08/2021 Time:  11:11 AM  Group Topic/Focus:  Orientation:   The focus of this group is to educate the patient on the purpose and policies of crisis stabilization and provide a format to answer questions about their admission.  The group details unit policies and expectations of patients while admitted.  Participation Level:  Active  Participation Quality:  Appropriate  Affect:  Appropriate  Cognitive:  Appropriate  Insight: Appropriate  Engagement in Group:  Engaged  Modes of Intervention:  Education   Thomas Hoff 08/08/2021, 11:11 AM

## 2021-08-08 NOTE — Progress Notes (Addendum)
[facility]  08/08/2021 5:11 PM Rebecca Vaughan  MRN:  [id number]  Chief Complaint: suicide attempt  Reason for Admission:  Rebecca Vaughan is a 21 y.o. female with a history of depression and anxiety, who was initially admitted for inpatient psychiatric hospitalization on 08/06/2021 after intentional OD on Trazodone in the context of psychosocial stressors. The patient is currently on Hospital Day 2.   Chart Review from last 24 hours:  The patient's chart was reviewed and nursing notes were reviewed. The patient's case was discussed in multidisciplinary team meeting. Per nursing, she attended groups and had no acute behavioral issues or safety concerns noted. Per MAR she was compliant with scheduled medications and did receive Vistaril X1 for anxiety.  Information Obtained Today During Patient Interview: The patient was seen and evaluated on the unit. On assessment today the patient reports that she was frustrated this morning because the team had not restarted her Synthroid or her home medication for endometriosis. She was advised that Synthroid has been reordered and she will need to bring Elagolix sodium 200mg  tabs in from home for inpatient use. She denies current SI, intent or plan and denies HI. She denies AVH, paranoia, ideas of reference or first rank symptoms. She states her sleep was interrupted overnight with her roommate being loud and intrusive. She reports that her appetite is "fine." She has started her menses but denies other physical complaints today. She denies medication side-effects with start of Topamax and Effexor.   Principal Problem: MDD (major depressive disorder), severe (HCC) Diagnosis: Principal Problem:   MDD (major depressive disorder), severe (HCC) Active Problems:   Anxiety  Total Time Spent in Direct Patient Care:  I personally spent 30 minutes on the unit in direct patient care. The direct patient care time included face-to-face time with the  patient, reviewing the patient's chart, communicating with other professionals, and coordinating care. Greater than 50% of this time was spent in counseling or coordinating care with the patient regarding goals of hospitalization, psycho-education, and discharge planning needs.  Past Psychiatric History: see H&P  Past Medical History:  Past Medical History:  Diagnosis Date   Anxiety    Constipation    COVID 10/2019   fever chills headache x 5 days all symptoms resolved   Depression    Endometriosis    Hypothyroidism    Pneumonia as child age 78   right lung   Thyroid disease    hyperthyroid   Wears glasses     Past Surgical History:  Procedure Laterality Date   LAPAROTOMY N/A 05/06/2020   Procedure: EXPLORATORY LAPAROTOMY, LYSIS OF ADHESIONS, DRAINAGE OF ENDOMETRIOMAS, PARTIAL RIGHT OVARIAN CYSTECTOMY;  Surgeon: Marcelle OverlieGrewal, Michelle, MD;  [facility];  Service: Gynecology;  Laterality: N/A;   Family History:  Family History  Problem Relation Age of Onset   Other Mother    Thyroid disease Mother    Hypertension Father    Diabetes Father    Hypercholesterolemia Father    Hypertension Maternal Grandmother    Hypertension Paternal Grandmother    Diabetes Paternal Grandmother    Hypertension Paternal Grandfather    Diabetes Paternal Grandfather    Family Psychiatric  History: see H&P  Social History:  Social History   Substance and Sexual Activity  Alcohol Use Not Currently     Social History   Substance and Sexual Activity  Drug Use Not Currently    Social History   Socioeconomic History   Marital status: Single  Spouse name: Not on file   Number of children: 0   Years of education: 22   Highest education level: Some college, no degree  Occupational History   Not on file  Tobacco Use   Smoking status: Never   Smokeless tobacco: Never   Tobacco comments:    No smokers at home  Vaping Use   Vaping Use: Never used  Substance and  Sexual Activity   Alcohol use: Not Currently   Drug use: Not Currently   Sexual activity: Yes    Birth control/protection: Pill  Other Topics Concern   Not on file  Social History Narrative   Is in 8th grade at [employer]   Patient  currently attends [employer]. 08/06/2021   Social Determinants of Health   Financial Resource Strain: Not on file  Food Insecurity: Not on file  Transportation Needs: Not on file  Physical Activity: Not on file  Stress: Not on file  Social Connections: Not on file    Sleep: Poor  Appetite:  Fair  Current Medications: Current Facility-Administered Medications  Medication Dose Route Frequency Provider Last Rate Last Admin   acetaminophen (TYLENOL) tablet 650 mg  650 mg Oral Q6H PRN Nwoko, Uchenna E, PA   650 mg at 08/06/21 2152   alum & mag hydroxide-simeth (MAALOX/MYLANTA) 200-200-20 MG/5ML suspension 15 mL  15 mL Oral Q4H PRN Nwoko, Uchenna E, PA       Elagolix Sodium TABS 200 mg  200 mg Oral BID Mason Jim, [employer] E, MD       feeding supplement (ENSURE ENLIVE / ENSURE PLUS) liquid 237 mL  237 mL Oral BID BM Massengill, Harrold Donath, MD   237 mL at 08/07/21 1602   hydrOXYzine (ATARAX) tablet 25 mg  25 mg Oral TID PRN Nwoko, Uchenna E, PA   25 mg at 08/07/21 0844   ibuprofen (ADVIL) tablet 600 mg  600 mg Oral Q6H PRN Comer Locket, MD   600 mg at 08/08/21 1002   [START ON 08/09/2021] levothyroxine (SYNTHROID) tablet 100 mcg  100 mcg Oral Q0600 Comer Locket, MD       loratadine (CLARITIN) tablet 10 mg  10 mg Oral Daily Nira Conn A, NP   10 mg at 08/08/21 0805   LORazepam (ATIVAN) tablet 1 mg  1 mg Oral Q6H PRN Roselle Locus, MD       Or   LORazepam (ATIVAN) injection 1 mg  1 mg Intramuscular Q6H PRN Hill, Shelbie Hutching, MD       magnesium hydroxide (MILK OF MAGNESIA) suspension 15 mL  15 mL Oral Daily PRN Nwoko, Uchenna E, PA       nicotine (NICODERM CQ - dosed in mg/24 hours) patch 14 mg  14 mg Transdermal Daily Mason Jim, Rayonna Heldman E, MD    14 mg at 08/08/21 1450   norethindrone-ethinyl estradiol-FE (LOESTRIN FE) 1-20 MG-MCG per tablet 1 tablet  1 tablet Oral QHS Nira Conn A, NP       topiramate (TOPAMAX) tablet 25 mg  25 mg Oral BID Roselle Locus, MD   25 mg at 08/08/21 0805   traZODone (DESYREL) tablet 50 mg  50 mg Oral QHS PRN Nwoko, Uchenna E, PA       venlafaxine XR (EFFEXOR-XR) 24 hr capsule 37.5 mg  37.5 mg Oral Q breakfast Hill, Shelbie Hutching, MD   37.5 mg at 08/08/21 1610    Lab Results:  Results for orders placed or performed during the hospital encounter of 08/06/21 (  from the past 48 hour(s))  Magnesium     Status: None   Collection Time: 08/07/21  6:23 PM  Result Value Ref Range   Magnesium 2.3 1.7 - 2.4 mg/dL    Comment: Performed at [facility], [address] [place], [place] [address]  VITAMIN D 25 Hydroxy (Vit-D Deficiency, Fractures)     Status: Abnormal   Collection Time: 08/07/21  6:23 PM  Result Value Ref Range   Vit D, 25-Hydroxy 14.88 (L) 30 - 100 ng/mL    Comment: (NOTE) Vitamin D deficiency has been defined by the Institute of Medicine  and an Endocrine Society practice guideline as a level of serum 25-OH  vitamin D less than 20 ng/mL (1,2). The Endocrine Society went on to  further define vitamin D insufficiency as a level between 21 and 29  ng/mL (2).  1. IOM (Institute of Medicine). 2010. Dietary reference intakes for  calcium and D. [place]: [employer]. 2. Holick MF, Binkley Laporte, Bischoff-Ferrari HA, et al. Evaluation,  treatment, and prevention of vitamin D deficiency: an Endocrine  Society clinical practice guideline, JCEM. 2011 Jul; 96(7): 1911-30.  Performed at [facility], [address], [place], [place] [address]   Vitamin B12     Status: None   Collection Time: 08/07/21  6:26 PM  Result Value Ref Range   Vitamin B-12 225 180 - 914 pg/mL    Comment: (NOTE) This assay is not validated for testing neonatal  or myeloproliferative syndrome specimens for Vitamin B12 levels. Performed at [facility], [address], [place], [place] [address]   Folate     Status: None   Collection Time: 08/07/21  6:26 PM  Result Value Ref Range   Folate 7.8 >5.9 ng/mL    Comment: Performed at [facility], [address], [place], [place] [address]  TSH     Status: None   Collection Time: 08/07/21  6:26 PM  Result Value Ref Range   TSH 2.470 0.350 - 4.500 uIU/mL    Comment: Performed by a 3rd Generation assay with a functional sensitivity of <=0.01 uIU/mL. Performed at [facility], [address], [place], [place] [address]   Hemoglobin A1c     Status: None   Collection Time: 08/08/21  6:37 AM  Result Value Ref Range   Hgb A1c MFr Bld 5.6 4.8 - 5.6 %    Comment: (NOTE) Pre diabetes:          5.7%-6.4%  Diabetes:              >6.4%  Glycemic control for   <7.0% adults with diabetes    Mean Plasma Glucose 114.02 mg/dL    Comment: Performed at [facility], [address], [place], [place] [address]    Blood Alcohol level:  Lab Results  Component Value Date   ETH <10 08/06/2021   ETH 175 (H) 03/19/2019    Metabolic Disorder Labs: Lab Results  Component Value Date   HGBA1C 5.6 08/08/2021   MPG 114.02 08/08/2021   MPG 126 (H) 12/16/2014   No results found for: PROLACTIN No results found for: CHOL, TRIG, HDL, CHOLHDL, VLDL, LDLCALC  Physical Findings:  Musculoskeletal: Strength & Muscle Tone: within normal limits Gait & Station: normal Patient leans: N/A  Psychiatric Specialty Exam:  Presentation  General Appearance: Casually dressed, adequate hygiene  Eye Contact:Good  Speech:Normal rate and fluency  Speech Volume:Normal  Mood  and Affect  Mood:Anxious; Depressed  Affect:constricted   Thought Process  Thought Processes:Linear, goal directed  Descriptions of Associations:Intact  Orientation:Full (Time,  Place and Person)  Thought Content:Denies SI, HI, AVH, paranoia, ideas of reference or first rank symptoms  Hallucinations:Hallucinations: None  Ideas of Reference:None  Suicidal Thoughts:Suicidal Thoughts: No  Homicidal Thoughts:Homicidal Thoughts: No   Sensorium  Memory:Fair  Judgment:Fair   Insight:Fair   Executive Functions  Concentration:Fair  Attention Span:Fair  Recall:Fair  Fund of Knowledge:Good  Language:Good   Psychomotor Activity  Psychomotor Activity:Psychomotor Activity: Normal   Assets  Assets:Communication Skills; Vocational/Educational   Sleep  6.5 hours   Physical Exam Vitals and nursing note reviewed.  Constitutional:      Appearance: Normal appearance.  HENT:     Head: Normocephalic.  Pulmonary:     Effort: Pulmonary effort is normal.  Neurological:     General: No focal deficit present.     Mental Status: She is alert.   Review of Systems  Respiratory:  Negative for shortness of breath.   Cardiovascular:  Negative for chest pain.  Gastrointestinal:  Negative for constipation, diarrhea, nausea and vomiting.  Neurological:  Negative for headaches.  Blood pressure 106/64, pulse 91, temperature 98.6 F (37 C), temperature source Oral, resp. rate 16, height 5\' 4"  (1.626 m), weight 70.3 kg, last menstrual period 07/08/2021, SpO2 100 %. Body mass index is 26.61 kg/m.   Treatment Plan Summary: Diagnoses / Active Problems: MDD recurrent severe without psychotic features  PLAN: Safety and Monitoring:  -- Voluntary admission to inpatient psychiatric unit for safety, stabilization and treatment  -- Daily contact with patient to assess and evaluate symptoms and progress in treatment  -- Patient's case to be discussed in multi-disciplinary team meeting  -- Observation Level : q15 minute checks  -- Vital signs:  q12 hours  -- Precautions: suicide, elopement, and assault  2. Psychiatric Diagnoses and Treatment:   MDD recurrent  severe without psychotic features  -- Continue Effexor XR 37.5mg  with plans to titrate up to 75mg  in coming days if tolerated  -- Continue Topamax 25mg  bid for chronic pain and mood stability  -- Start Melatonin 5mg  po qhs PRN insomnia; holding Trazodone given recent OD on med  -- Continue Vistaril 25mg  tid PRN anxiety  -- Encouraged patient to participate in unit milieu and in scheduled group therapies     3. Medical Issues Being Addressed:   Tobacco Use Disorder  -- Nicotine patch 14mg /24 hours ordered  -- Smoking cessation encouraged   Hypothyroidism  -- Restart Synthroid daily  -- Admission TSH 2.470   Endometriosis  -- Patient to bring home Elagolix sodium 200mg  bid for use  -- Home BC pill ordered  -- Topamax 25mg  bid to help with chronic pain   Low Vitamin D  -- start Vit D 50,000IU weekly  4. Discharge Planning:   -- Social work and case management to assist with discharge planning and identification of hospital follow-up needs prior to discharge  -- Estimated LOS: 5-7 days  -- Discharge Concerns: Need to establish a safety plan; Medication compliance and effectiveness  -- Discharge Goals: Return home with outpatient referrals for mental health follow-up including medication management/psychotherapy   , MD, FAPA 08/08/2021, 5:11 PM

## 2021-08-08 NOTE — Progress Notes (Signed)
Provider paged and made aware pt requesting order  for home meds: Larin Fe, Synthroid, and Elagolix sodium.   Pt's birth control Larin Fe is in pt's medicine draw in nurse med room.

## 2021-08-08 NOTE — Group Note (Signed)
Recreation Therapy Group Note   Group Topic:Animal Assisted Therapy   Group Date: 08/08/2021 Start Time: 1430 End Time: 1510 Facilitators: Caroll Rancher, LRT,CTRS Location: [address] Dayroom   Animal-Assisted Activity (AAA) Program Checklist/Progress Note Patient Eligibility Criteria Checklist & Daily Group note for Rec Tx Intervention  AAA/T Program Assumption of Risk Form signed by Patient/ or Parent Legal Guardian YES  Patient is free of allergies or severe asthma  YES  Patient reports no fear of animals YES  Patient reports no history of cruelty to animals YES  Patient understands their participation is voluntary YES  Patient washes hands before animal contact YES  Patient washes hands after animal contact YES   Group Description: Patients provided opportunity to interact with trained and credentialed [employer] Therapy dog and the community volunteer/dog handler. Patients practiced appropriate animal interaction and were educated on dog safety outside of the hospital in common community settings. Patients were allowed to use dog toys and other items to practice commands, engage the dog in play, and/or complete routine aspects of animal care.   Education: Charity fundraiser, Health visitor, Communication & Social Skills    Affect/Mood: Appropriate   Participation Level: Engaged    Clinical Observations/Individualized Feedback: Pt attended and participated in group session.  Pt engaged with therapy dog team in an appropriate manner.     Plan: Continue to engage patient in RT group sessions 2-3x/week.   Caroll Rancher, Antonietta Jewel 08/08/2021 3:33 PM

## 2021-08-08 NOTE — Progress Notes (Signed)
Patient is complaining that her roommate keeps taking showers, is constantly itching and scratching, and has trashed the bathroom floor. The nurse and charge nurse have both been notified of the situation.

## 2021-08-08 NOTE — Plan of Care (Signed)
  Problem: Activity: Goal: Interest or engagement in leisure activities will improve Outcome: Progressing   Problem: Coping: Goal: Coping ability will improve Outcome: Progressing   Problem: Safety: Goal: Ability to disclose and discuss suicidal ideas will improve Outcome: Progressing   

## 2021-08-08 NOTE — Progress Notes (Signed)
The patient did not share in group this evening.

## 2021-08-08 NOTE — Plan of Care (Signed)
°  Problem: Education: °Goal: Ability to make informed decisions regarding treatment will improve °Outcome: Progressing °  °Problem: Coping: °Goal: Coping ability will improve °Outcome: Progressing °  °

## 2021-08-08 NOTE — [facility] Suicide Risk Assessment (Signed)
[facility] INPATIENT:  Family/Significant Other Suicide Prevention Education  Suicide Prevention Education:  Education Completed; Ray Church [phone] (Mother) has been identified by the patient as the family member/significant other with whom the patient will be residing, and identified as the person(s) who will aid the patient in the event of a mental health crisis (suicidal ideations/suicide attempt).  With written consent from the patient, the family member/significant other has been provided the following suicide prevention education, prior to the and/or following the discharge of the patient.  The suicide prevention education provided includes the following: Suicide risk factors Suicide prevention and interventions National Suicide Hotline telephone number [facility] assessment telephone number [facility] Emergency Assistance 911 [facility] and/or Residential Mobile Crisis Unit telephone number  Request made of family/significant other to: Remove weapons (e.g., guns, rifles, knives), all items previously/currently identified as safety concern.   Remove drugs/medications (over-the-counter, prescriptions, illicit drugs), all items previously/currently identified as a safety concern.  The family member/significant other verbalizes understanding of the suicide prevention education information provided.  The family member/significant other agrees to remove the items of safety concern listed above.  CSW spoke with Mrs. Bynum who states that her daughter was overwhelmed about her up-coming court date.  She states that she is a retired Emergency planning/management officer and that her daughter has not wanted to discuss the matter with her.  She states that she also does not believe that her daughter took the pills she claimed to have taken.  She reports that her daughter has no previous suicide attempt.  Mrs. Bynum states that there are no firearms or weapons in the home.  She also states that her  daughter can return to the home after discharge.  CSW completed SPE with Mrs. Bynum.   [employer] Kung Deeann Servidio 08/08/2021, 1:36 PM

## 2021-08-09 LAB — VITAMIN B1: Vitamin B1 (Thiamine): 89.8 nmol/L (ref 66.5–200.0)

## 2021-08-09 MED ORDER — NICOTINE 21 MG/24HR TD PT24
21.0000 mg | MEDICATED_PATCH | Freq: Every day | TRANSDERMAL | Status: DC
  Administered 2021-08-09: 21 mg via TRANSDERMAL
  Filled 2021-08-09 (×6): qty 1

## 2021-08-09 MED ORDER — VENLAFAXINE HCL ER 75 MG PO CP24
75.0000 mg | ORAL_CAPSULE | Freq: Every day | ORAL | Status: DC
  Administered 2021-08-10 – 2021-08-12 (×3): 75 mg via ORAL
  Filled 2021-08-09 (×7): qty 1

## 2021-08-09 NOTE — Progress Notes (Signed)
[facility] MD Progress Note  08/09/2021 4:04 PM Rebecca Vaughan  MRN:  [id number]  Chief Complaint: suicide attempt  Reason for Admission:  Rebecca Vaughan is a 21 y.o. female with a history of depression and anxiety, who was initially admitted for inpatient psychiatric hospitalization on 08/06/2021 after intentional OD on Trazodone in the context of psychosocial stressors. The patient is currently on Hospital Day 3.   Chart Review from last 24 hours:  The patient's chart was reviewed and nursing notes were reviewed. The patient's case was discussed in multidisciplinary team meeting. Per nursing, patient had no acute behavioral issues or safety concerns noted. She endorsed mild anxiety to staff. She attended some groups. Per [facility] she was compliant with scheduled medications except for Elagolix sodium which was not available yesterday. She did receive Vistaril X1 for anxiety and Advil X2 for pain yesterday.  Information Obtained Today During Patient Interview: The patient was seen and evaluated on the unit.  She reports that she has been attending group and is starting to feel more social around peers.  She states her anxiety is reducing and she feels less depressed today.  She reports a good appetite and improved sleep.  She denies SI, HI, AVH, paranoia or ideas of reference.  She denies any side effects with start of Effexor and we discussed potential dose titration up on med further tomorrow.  She is planning to stay with her mother after discharge and we discussed the possibility of a partial hospitalization program referral and she agrees to consider this.  She voices no physical complaints other than some menstrual cramps.  Principal Problem: MDD (major depressive disorder), severe (HCC) Diagnosis: Principal Problem:   MDD (major depressive disorder), severe (HCC) Active Problems:   Anxiety  Total Time Spent in Direct Patient Care:  I personally spent 30 minutes on the unit in direct patient  care. The direct patient care time included face-to-face time with the patient, reviewing the patient's chart, communicating with other professionals, and coordinating care. Greater than 50% of this time was spent in counseling or coordinating care with the patient regarding goals of hospitalization, psycho-education, and discharge planning needs.  Past Psychiatric History: see H&P  Past Medical History:  Past Medical History:  Diagnosis Date   Anxiety    Constipation    COVID 10/2019   fever chills headache x 5 days all symptoms resolved   Depression    Endometriosis    Hypothyroidism    Pneumonia as child age 89   right lung   Thyroid disease    hyperthyroid   Wears glasses     Past Surgical History:  Procedure Laterality Date   LAPAROTOMY N/A 05/06/2020   Procedure: EXPLORATORY LAPAROTOMY, LYSIS OF ADHESIONS, DRAINAGE OF ENDOMETRIOMAS, PARTIAL RIGHT OVARIAN CYSTECTOMY;  Surgeon: Marcelle Overlie, MD;  Location: [facility];  Service: Gynecology;  Laterality: N/A;   Family History:  Family History  Problem Relation Age of Onset   Other Mother    Thyroid disease Mother    Hypertension Father    Diabetes Father    Hypercholesterolemia Father    Hypertension Maternal Grandmother    Hypertension Paternal Grandmother    Diabetes Paternal Grandmother    Hypertension Paternal Grandfather    Diabetes Paternal Grandfather    Family Psychiatric  History: see H&P  Social History:  Social History   Substance and Sexual Activity  Alcohol Use Not Currently     Social History   Substance and Sexual Activity  Drug Use Not Currently    Social History   Socioeconomic History   Marital status: Single    Spouse name: Not on file   Number of children: 0   Years of education: 8613   Highest education level: Some college, no degree  Occupational History   Not on file  Tobacco Use   Smoking status: Never   Smokeless tobacco: Never   Tobacco comments:    No  smokers at home  Vaping Use   Vaping Use: Never used  Substance and Sexual Activity   Alcohol use: Not Currently   Drug use: Not Currently   Sexual activity: Yes    Birth control/protection: Pill  Other Topics Concern   Not on file  Social History Narrative   Is in 8th grade at [employer]   Patient  currently attends [employer]. 08/06/2021   Social Determinants of Health   Financial Resource Strain: Not on file  Food Insecurity: Not on file  Transportation Needs: Not on file  Physical Activity: Not on file  Stress: Not on file  Social Connections: Not on file    Sleep: Improved   Appetite:  Good  Current Medications: Current Facility-Administered Medications  Medication Dose Route Frequency Provider Last Rate Last Admin   acetaminophen (TYLENOL) tablet 650 mg  650 mg Oral Q6H PRN Nwoko, Uchenna E, PA   650 mg at 08/06/21 2152   alum & mag hydroxide-simeth (MAALOX/MYLANTA) 200-200-20 MG/5ML suspension 15 mL  15 mL Oral Q4H PRN Nwoko, Uchenna E, PA       Elagolix Sodium TABS 200 mg  200 mg Oral BID Smantha Boakye E, MD   200 mg at 08/09/21 1000   feeding supplement (ENSURE ENLIVE / ENSURE PLUS) liquid 237 mL  237 mL Oral BID BM Massengill, Harrold DonathNathan, MD   237 mL at 08/07/21 1602   hydrOXYzine (ATARAX) tablet 25 mg  25 mg Oral TID PRN Nwoko, Uchenna E, PA   25 mg at 08/09/21 1003   ibuprofen (ADVIL) tablet 600 mg  600 mg Oral Q6H PRN Comer LocketSingleton, Day Deery E, MD   600 mg at 08/09/21 [id number]   levothyroxine (SYNTHROID) tablet 100 mcg  100 mcg Oral Q0600 Comer LocketSingleton, Matthewjames Petrasek E, MD   100 mcg at 08/09/21 [id number]   loratadine (CLARITIN) tablet 10 mg  10 mg Oral Daily Nira ConnBerry, Jason A, NP   10 mg at 08/08/21 0805   LORazepam (ATIVAN) tablet 1 mg  1 mg Oral Q6H PRN Roselle LocusHill, Stephanie Leigh, MD       Or   LORazepam (ATIVAN) injection 1 mg  1 mg Intramuscular Q6H PRN Hill, Shelbie HutchingStephanie Leigh, MD       magnesium hydroxide (MILK OF MAGNESIA) suspension 15 mL  15 mL Oral Daily PRN Nwoko, Uchenna E, PA        melatonin tablet 5 mg  5 mg Oral QHS PRN Mason JimSingleton, Aceton Kinnear E, MD       nicotine (NICODERM CQ - dosed in mg/24 hours) patch 21 mg  21 mg Transdermal Daily Jaquae Rieves E, MD       norethindrone-ethinyl estradiol-FE (LOESTRIN FE) 1-20 MG-MCG per tablet 1 tablet  1 tablet Oral QHS Nira ConnBerry, Jason A, NP   1 tablet at 08/08/21 2142   topiramate (TOPAMAX) tablet 25 mg  25 mg Oral BID Roselle LocusHill, Stephanie Leigh, MD   25 mg at 08/09/21 1002   venlafaxine XR (EFFEXOR-XR) 24 hr capsule 37.5 mg  37.5 mg Oral Q breakfast Hill, Shelbie HutchingStephanie Leigh, MD  37.5 mg at 08/09/21 1002   Vitamin D (Ergocalciferol) (DRISDOL) capsule 50,000 Units  50,000 Units Oral Q7 days Comer Locket, MD   50,000 Units at 08/08/21 1820    Lab Results:  Results for orders placed or performed during the hospital encounter of 08/06/21 (from the past 48 hour(s))  Magnesium     Status: None   Collection Time: 08/07/21  6:23 PM  Result Value Ref Range   Magnesium 2.3 1.7 - 2.4 mg/dL    Comment: Performed at [facility], [address], [place], [place] [address]  VITAMIN D 25 Hydroxy (Vit-D Deficiency, Fractures)     Status: Abnormal   Collection Time: 08/07/21  6:23 PM  Result Value Ref Range   Vit D, 25-Hydroxy 14.88 (L) 30 - 100 ng/mL    Comment: (NOTE) Vitamin D deficiency has been defined by the Institute of Medicine  and an Endocrine Society practice guideline as a level of serum 25-OH  vitamin D less than 20 ng/mL (1,2). The Endocrine Society went on to  further define vitamin D insufficiency as a level between 21 and 29  ng/mL (2).  1. IOM (Institute of Medicine). 2010. Dietary reference intakes for  calcium and D. [place]: [employer]. 2. Holick MF, Binkley [place], Bischoff-Ferrari HA, et al. Evaluation,  treatment, and prevention of vitamin D deficiency: an Endocrine  Society clinical practice guideline, JCEM. 2011 Jul; 96(7): 1911-30.  Performed at [facility], [address], [place], [place] [address]   Vitamin B1     Status: None   Collection Time: 08/07/21  6:23 PM  Result Value Ref Range   Vitamin B1 (Thiamine) 89.8 66.5 - 200.0 nmol/L    Comment: (NOTE) This test was developed and its performance characteristics determined by [facility]. It has not been cleared or approved by the Food and Drug Administration. Performed At: [facility] [address] [place], [place] [id number] Jolene Schimke MD WG:[id number]   Vitamin B12     Status: None   Collection Time: 08/07/21  6:26 PM  Result Value Ref Range   Vitamin B-12 225 180 - 914 pg/mL    Comment: (NOTE) This assay is not validated for testing neonatal or myeloproliferative syndrome specimens for Vitamin B12 levels. Performed at [facility], [address], [place], [place] [address]   Folate     Status: None   Collection Time: 08/07/21  6:26 PM  Result Value Ref Range   Folate 7.8 >5.9 ng/mL    Comment: Performed at [facility], [address], [place], [place] [address]  TSH     Status: None   Collection Time: 08/07/21  6:26 PM  Result Value Ref Range   TSH 2.470 0.350 - 4.500 uIU/mL    Comment: Performed by a 3rd Generation assay with a functional sensitivity of <=0.01 uIU/mL. Performed at [facility], [address], [place], [place] [address]   Hemoglobin A1c     Status: None   Collection Time: 08/08/21  6:37 AM  Result Value Ref Range   Hgb A1c MFr Bld 5.6 4.8 - 5.6 %    Comment: (NOTE) Pre diabetes:          5.7%-6.4%  Diabetes:              >6.4%  Glycemic control for   <7.0% adults with diabetes    Mean Plasma Glucose 114.02 mg/dL    Comment: Performed  at [facility], [address] [place], [place] [address]    Blood Alcohol level:  Lab Results  Component Value Date   ETH <10 08/06/2021   ETH 175 (H) 03/19/2019    Metabolic Disorder Labs: Lab Results  Component Value Date   HGBA1C 5.6  08/08/2021   MPG 114.02 08/08/2021   MPG 126 (H) 12/16/2014   No results found for: PROLACTIN No results found for: CHOL, TRIG, HDL, CHOLHDL, VLDL, LDLCALC  Physical Findings:  Musculoskeletal: Strength & Muscle Tone: within normal limits Gait & Station: normal Patient leans: N/A  Psychiatric Specialty Exam:  Presentation  General Appearance: Casually dressed, adequate hygiene  Eye Contact:Good  Speech:Normal rate and fluency  Speech Volume:Normal  Mood and Affect  Mood:described as improved   Affect:brighter appearing   Thought Process  Thought Processes:Linear, goal directed  Descriptions of Associations:Intact  Orientation:Full (Time, Place and Person)  Thought Content:Denies SI, HI, AVH, paranoia, ideas of reference or first rank symptoms  Hallucinations:None  Ideas of Reference:None  Suicidal Thoughts:Denied  Homicidal Thoughts:Denied   Sensorium  Memory:Fair  Judgment:Fair   Insight:Fair   Executive Functions  Concentration:Fair  Attention Span:Fair  Recall:Fair  Fund of Knowledge:Good  Language:Good   Psychomotor Activity  Psychomotor Activity:No data recorded   Assets  Assets:Communication Skills; Vocational/Educational   Sleep  5.25 hours   Physical Exam Vitals and nursing note reviewed.  Constitutional:      Appearance: Normal appearance.  HENT:     Head: Normocephalic.  Pulmonary:     Effort: Pulmonary effort is normal.  Neurological:     General: No focal deficit present.     Mental Status: She is alert.   Review of Systems  Respiratory:  Negative for shortness of breath.   Cardiovascular:  Negative for chest pain.  Gastrointestinal:  Negative for constipation, diarrhea, nausea and vomiting.  Neurological:  Negative for headaches.  Blood pressure 94/61, pulse 68, temperature 98 F (36.7 C), temperature source Oral, resp. rate 16, height 5\' 4"  (1.626 m), weight 70.3 kg, last menstrual period 07/08/2021, SpO2  94 %. Body mass index is 26.61 kg/m.   Treatment Plan Summary: Diagnoses / Active Problems: MDD recurrent severe without psychotic features  PLAN: Safety and Monitoring:  -- Voluntary admission to inpatient psychiatric unit for safety, stabilization and treatment  -- Daily contact with patient to assess and evaluate symptoms and progress in treatment  -- Patient's case to be discussed in multi-disciplinary team meeting  -- Observation Level : q15 minute checks  -- Vital signs:  q12 hours  -- Precautions: suicide, elopement, and assault  2. Psychiatric Diagnoses and Treatment:   MDD recurrent severe without psychotic features  -- Continue Effexor XR 37.5mg  with plans to titrate up to 75mg  tomorrow   -- Continue Topamax 25mg  bid for chronic pain and mood stability  -- Continue Melatonin 5mg  po qhs PRN insomnia; holding Trazodone given recent OD on med  -- Continue Vistaril 25mg  tid PRN anxiety  -- Encouraged patient to participate in unit milieu and in scheduled group therapies     3. Medical Issues Being Addressed:   Tobacco Use Disorder  -- Nicotine patch 14mg /24 hours ordered  -- Smoking cessation encouraged   Hypothyroidism  -- Continue Synthroid daily  -- Admission TSH 2.470   Endometriosis  -- Restart home Elagolix sodium 200mg  bid   -- Home BC pill ordered  -- Topamax 25mg  bid to help with chronic pain   Low Vitamin D  --  start Vit D 50,000IU weekly  4. Discharge Planning:   -- Social work and case management to assist with discharge planning and identification of hospital follow-up needs prior to discharge  -- Estimated LOS: 5-7 days  -- Discharge Concerns: Need to establish a safety plan; Medication compliance and effectiveness  -- Discharge Goals: Return home with outpatient referrals for mental health follow-up including medication management/psychotherapy   Comer Locket, MD, FAPA 08/09/2021, 4:04 PM

## 2021-08-09 NOTE — Group Note (Signed)
LCSW Group Therapy Note   Group Date: 08/09/2021 Start Time: 1300 End Time: 1400   Type of Therapy and Topic:  Group Therapy: Boundaries  Participation Level:  Active  Description of Group: This group will address the use of boundaries in their personal lives. Patients will explore why boundaries are important, the difference between healthy and unhealthy boundaries, and negative and postive outcomes of different boundaries and will look at how boundaries can be crossed.  Patients will be encouraged to identify current boundaries in their own lives and identify what kind of boundary is being set. Facilitators will guide patients in utilizing problem-solving interventions to address and correct types boundaries being used and to address when no boundary is being used. Understanding and applying boundaries will be explored and addressed for obtaining and maintaining a balanced life. Patients will be encouraged to explore ways to assertively make their boundaries and needs known to significant others in their lives, using other group members and facilitator for role play, support, and feedback.  Therapeutic Goals:  1.  Patient will identify areas in their life where setting clear boundaries could be  used to improve their life.  2.  Patient will identify signs/triggers that a boundary is not being respected. 3.  Patient will identify two ways to set boundaries in order to achieve balance in  their lives: 4.  Patient will demonstrate ability to communicate their needs and set boundaries  through discussion and/or role plays  Summary of Patient Progress:  Patient was present for the entirety of the group session. Patient was an active listener and participated in the topic of discussion, provided helpful advice to others, and added nuance to topic of conversation.   Therapeutic Modalities:   Cognitive Behavioral Therapy Solution-Focused Therapy  Almedia Balls 08/09/2021  2:20 PM

## 2021-08-09 NOTE — Progress Notes (Signed)
Nutrition Brief Note  Patient identified on the Malnutrition Screening Tool (MST) Report  No weight loss per weight records below. Pt reports "fine" appetite.  Wt Readings from Last 15 Encounters:  08/06/21 70.3 kg  08/06/21 70.3 kg  01/01/21 68 kg (81 %, Z= 0.87)*  05/07/20 71.3 kg (87 %, Z= 1.13)*  03/22/20 69.4 kg (85 %, Z= 1.03)*  03/19/19 68 kg (85 %, Z= 1.03)*  04/12/18 67.1 kg (85 %, Z= 1.04)*  07/25/15 60.1 kg (82 %, Z= 0.92)*  12/16/14 62.1 kg (89 %, Z= 1.22)*  02/05/14 58.6 kg (90 %, Z= 1.26)*  02/05/14 56.7 kg (87 %, Z= 1.13)*  09/28/11 42.2 kg (85 %, Z= 1.02)*   * Growth percentiles are based on CDC (Girls, 2-20 Years) data.    Body mass index is 26.61 kg/m. Patient meets criteria for overweight based on current BMI.   Current diet order is regular. Labs and medications reviewed.   No nutrition interventions warranted at this time. If nutrition issues arise, please consult RD.   Clayton Bibles, MS, RD, LDN Inpatient Clinical Dietitian Contact information available via Amion

## 2021-08-09 NOTE — BHH Group Notes (Signed)
The focus of this group is to help patients establish daily goals to achieve during treatment and discuss how the patient can incorporate goal setting into their daily lives to aide in recovery.  Pt stated that her goal is " BE out of the room, mingling, be more social and not secluded"

## 2021-08-09 NOTE — BHH Group Notes (Signed)
PT attended NA group and was appropriate and attentive. 

## 2021-08-09 NOTE — Group Note (Signed)
Recreation Therapy Group Note   Group Topic:Team Building  Group Date: 08/09/2021 Start Time: 0930 End Time: 1000 Facilitators: Caroll Rancher, LRT,CTRS Location: [address] Day[address]   Goal Area(s) Addresses:  Patient will effectively work with peer towards shared goal.  Patient will identify skills used to make activity successful.  Patient will identify how skills used during activity can be applied to reach post d/c goals.    Group Description: [employer]. In teams of 5-6, patients were given 25 small craft pipe cleaners. Using the materials provided, patients were instructed to compete again the opposing team(s) to build the tallest free-standing structure from floor level. The activity was timed; difficulty increased by Clinical research associate as Production designer, theatre/television/film continued.  Systematically resources were removed with additional directions for example, placing one arm behind their back, working in silence, and shape stipulations. LRT facilitated post-activity discussion reviewing team processes and necessary communication skills involved in completion. Patients were encouraged to reflect how the skills utilized, or not utilized, in this activity can be incorporated to positively impact support systems post discharge.   Affect/Mood: Appropriate   Participation Level: Engaged   Participation Quality: Independent   Behavior: Appropriate   Speech/Thought Process: Focused   Insight: Good   Judgement: Good   Modes of Intervention: Team-building   Patient Response to Interventions:  Engaged   Education Outcome:  Acknowledges education and In group clarification offered    Clinical Observations/Individualized Feedback: Pt worked well with peers developing a plan for their tower.  Pt did become frustrated with peers at one point, expressing they didn't know what they what they were doing.    Plan: Continue to engage patient in RT group sessions 2-3x/week.   Caroll Rancher,  LRT,CTRS 08/09/2021 11:10 AM

## 2021-08-09 NOTE — BHH Group Notes (Signed)
Adult Psychoeducational Group Note  Date:  08/09/2021 Time:  11:18 AM  Group Topic/Focus:  Healthy Communication:   The focus of this group is to discuss communication, barriers to communication, as well as healthy ways to communicate with others.  Participation Level:  Active  Participation Quality:  Appropriate  Affect:  Appropriate  Cognitive:  Appropriate  Insight: Appropriate  Engagement in Group:  Engaged  Modes of Intervention:  Discussion  Additional Comments:    Dub Mikes 08/09/2021, 11:18 AM

## 2021-08-09 NOTE — Progress Notes (Signed)
   08/09/21 1000  Psych Admission Type (Psych Patients Only)  Admission Status Voluntary  Psychosocial Assessment  Patient Complaints Anxiety  Eye Contact Fair  Facial Expression Animated  Affect Anxious  Speech Logical/coherent  Interaction Assertive  Motor Activity Other (Comment) (WDL)  Appearance/Hygiene Unremarkable  Behavior Characteristics Cooperative  Mood Anxious  Thought Process  Coherency WDL  Content WDL  Delusions None reported or observed  Perception WDL  Hallucination None reported or observed  Judgment Poor  Confusion WDL  Danger to Self  Current suicidal ideation? Denies  Danger to Others  Danger to Others None reported or observed

## 2021-08-09 NOTE — Progress Notes (Signed)
     08/09/21 0620  Vital Signs  Temp 98 F (36.7 C)  Temp Source Oral  Pulse Rate (!) 109  Pulse Rate Source Monitor  BP (!) 148/89  BP Method Automatic  Oxygen Therapy  SpO2 100 %     08/09/21 0621  Vital Signs  Pulse Rate (!) 115  BP (!) 144/129  BP Method Automatic    BP/HR elevated.  Please recheck BP/HR.

## 2021-08-10 NOTE — Progress Notes (Signed)
   08/10/21 1100  Psych Admission Type (Psych Patients Only)  Admission Status Voluntary  Psychosocial Assessment  Patient Complaints Anxiety  Eye Contact Fair  Facial Expression Animated  Affect Anxious  Speech Logical/coherent  Interaction Assertive  Motor Activity Other (Comment)  Appearance/Hygiene Unremarkable  Behavior Characteristics Cooperative  Mood Anxious  Thought Process  Coherency WDL  Content WDL  Delusions None reported or observed  Perception WDL  Hallucination None reported or observed  Judgment Poor  Confusion WDL  Danger to Self  Current suicidal ideation? Denies  Danger to Others  Danger to Others None reported or observed

## 2021-08-10 NOTE — Progress Notes (Signed)
     08/09/21 2125  Psych Admission Type (Psych Patients Only)  Admission Status Voluntary  Psychosocial Assessment  Patient Complaints Anxiety  Eye Contact Fair  Facial Expression Animated  Affect Anxious  Speech Logical/coherent  Interaction Assertive  Motor Activity Other (Comment) (WNL)  Appearance/Hygiene Unremarkable  Behavior Characteristics Cooperative  Mood Pleasant;Anxious  Thought Process  Coherency WDL  Content WDL  Delusions None reported or observed  Perception WDL  Hallucination None reported or observed  Judgment Poor  Confusion WDL  Danger to Self  Current suicidal ideation? Denies  Self-Injurious Behavior No self-injurious ideation or behavior indicators observed or expressed   Agreement Not to Harm Self Yes  Description of Agreement verbal contract for safety  Danger to Others  Danger to Others None reported or observed

## 2021-08-10 NOTE — Group Note (Signed)
Date:  08/10/2021 Time:  10:13 AM  Group Topic/Focus:  Orientation:   The focus of this group is to educate the patient on the purpose and policies of crisis stabilization and provide a format to answer questions about their admission.  The group details unit policies and expectations of patients while admitted.    Participation Level:  Active  Participation Quality:  Appropriate  Affect:  Appropriate  Cognitive:  Appropriate  Insight: Appropriate  Engagement in Group:  Engaged  Modes of Intervention:  Discussion  Additional Comments:    Garvin Fila 08/10/2021, 10:13 AM

## 2021-08-10 NOTE — Plan of Care (Signed)
  Problem: Education: Goal: Ability to make informed decisions regarding treatment will improve Outcome: Progressing   Problem: Coping: Goal: Coping ability will improve Outcome: Progressing   Problem: Health Behavior/Discharge Planning: Goal: Identification of resources available to assist in meeting health care needs will improve Outcome: Progressing   Problem: Medication: Goal: Compliance with prescribed medication regimen will improve Outcome: Progressing   

## 2021-08-10 NOTE — Progress Notes (Signed)
[place] Group Notes:  (Nursing/MHT/Case Management/Adjunct)  Date:  08/10/2021  Time:  2015 Type of Therapy:   wrap up group  Participation Level:  Active  Participation Quality:  Appropriate, Attentive, Sharing, and Supportive  Affect:  Flat  Cognitive:  Alert  Insight:  Improving  Engagement in Group:  Engaged  Modes of Intervention:  Clarification, Education, and Support  Summary of Progress/Problems: Positive thinking and positive change were discussed.   Shellia Cleverly 08/10/2021, 9:28 PM

## 2021-08-10 NOTE — Progress Notes (Signed)
[facility]  08/10/2021 7:07 AM Rebecca Vaughan Date  MRN:  [id number]  Chief Complaint: suicide attempt  Reason for Admission:  Rebecca CornDanielle Vaughan Hatfield is a 21 y.o. female with a history of depression and anxiety, who was initially admitted for inpatient psychiatric hospitalization on 08/06/2021 after intentional OD on Trazodone in the context of psychosocial stressors. The patient is currently on Hospital Day 4.   Chart Review from last 24 hours:  The patient's chart was reviewed and nursing notes were reviewed. The patient's case was discussed in multidisciplinary team meeting. Per nursing, patient had no acute behavioral issues or safety concerns noted. She attended groups. Per MAR she was compliant with scheduled medications except refusal of Claritin. She did receive Vistaril X2 for anxiety and Advil X2 for pain.   Information Obtained Today During Patient Interview: The patient was seen and evaluated on the unit.  She states she is having menstrual cramps and needs to take a nap this afternoon as a result. She denies other physical complaints and denies side-effects with dose increase in her Effexor. She denies SI, HI, AVH, paranoia, or delusions. She reports improved sleep and appetite. She has been attending groups and states her mood is improving. She plans to stay with her mother after discharge and [facility]. She gives me consent to speak to her mother to see if she is nearing clinical baseline.   Call to mother Deidra [phone]) for 25 minutes: Mother feels the patient did try to hurt herself prior to admission out of sense of disappointing family and in an effort to "soften the blow" of disclosing legal issues to family. She has court on the 14th and we discussed recent stressors that led to admission. Mother plans to observe her closely after discharge and agrees [facility]. We discussed potential for family meeting prior to discharge.    Principal Problem: MDD (major depressive disorder), severe (HCC) Diagnosis: Principal Problem:   MDD (major depressive disorder), severe (HCC) Active Problems:   Anxiety  Total Time Spent in Direct Patient Care:  I personally spent 30 minutes on the unit in direct patient care. The direct patient care time included face-to-face time with the patient, reviewing the patient's chart, communicating with other professionals, and coordinating care. Greater than 50% of this time was spent in counseling or coordinating care with the patient regarding goals of hospitalization, psycho-education, and discharge planning needs.  Past Psychiatric History: see H&P  Past Medical History:  Past Medical History:  Diagnosis Date   Anxiety    Constipation    COVID 10/2019   fever chills headache x 5 days all symptoms resolved   Depression    Endometriosis    Hypothyroidism    Pneumonia as child age 89   right lung   Thyroid disease    hyperthyroid   Wears glasses     Past Surgical History:  Procedure Laterality Date   LAPAROTOMY N/A 05/06/2020   Procedure: EXPLORATORY LAPAROTOMY, LYSIS OF ADHESIONS, DRAINAGE OF ENDOMETRIOMAS, PARTIAL RIGHT OVARIAN CYSTECTOMY;  Surgeon: Marcelle OverlieGrewal, Michelle, MD;  [facility] [place];  Service: Gynecology;  Laterality: N/A;   Family History:  Family History  Problem Relation Age of Onset   Other Mother    Thyroid disease Mother    Hypertension Father    Diabetes Father    Hypercholesterolemia Father    Hypertension Maternal Grandmother    Hypertension Paternal Grandmother    Diabetes Paternal Grandmother  Hypertension Paternal Grandfather    Diabetes Paternal Grandfather    Family Psychiatric  History: see H&P  Social History:  Social History   Substance and Sexual Activity  Alcohol Use Not Currently     Social History   Substance and Sexual Activity  Drug Use Not Currently    Social History   Socioeconomic History   Marital  status: Single    Spouse name: Not on file   Number of children: 0   Years of education: 13   Highest education level: Some college, no degree  Occupational History   Not on file  Tobacco Use   Smoking status: Never   Smokeless tobacco: Never   Tobacco comments:    No smokers at home  Vaping Use   Vaping Use: Never used  Substance and Sexual Activity   Alcohol use: Not Currently   Drug use: Not Currently   Sexual activity: Yes    Birth control/protection: Pill  Other Topics Concern   Not on file  Social History Narrative   Is in 8th grade at [employer]   Patient  currently attends [employer]. 08/06/2021   Social Determinants of Health   Financial Resource Strain: Not on file  Food Insecurity: Not on file  Transportation Needs: Not on file  Physical Activity: Not on file  Stress: Not on file  Social Connections: Not on file    Sleep: Good  Appetite:  Good  Current Medications: Current Facility-Administered Medications  Medication Dose Route Frequency Provider Last Rate Last Admin   acetaminophen (TYLENOL) tablet 650 mg  650 mg Oral Q6H PRN Nwoko, Uchenna E, PA   650 mg at 08/06/21 2152   alum & mag hydroxide-simeth (MAALOX/MYLANTA) 200-200-20 MG/5ML suspension 15 mL  15 mL Oral Q4H PRN Nwoko, Uchenna E, PA       Elagolix Sodium TABS 200 mg  200 mg Oral BID Mason Jim, Nashira Mcglynn E, MD   200 mg at 08/09/21 1629   feeding supplement (ENSURE ENLIVE / ENSURE PLUS) liquid 237 mL  237 mL Oral BID BM Massengill, Harrold Donath, MD   237 mL at 08/10/21 0337   hydrOXYzine (ATARAX) tablet 25 mg  25 mg Oral TID PRN Nwoko, Uchenna E, PA   25 mg at 08/09/21 2125   ibuprofen (ADVIL) tablet 600 mg  600 mg Oral Q6H PRN Comer Locket, MD   600 mg at 08/09/21 2125   levothyroxine (SYNTHROID) tablet 100 mcg  100 mcg Oral Q0600 Comer Locket, MD   100 mcg at 08/10/21 0630   loratadine (CLARITIN) tablet 10 mg  10 mg Oral Daily Nira Conn A, NP   10 mg at 08/08/21 0805   LORazepam (ATIVAN)  tablet 1 mg  1 mg Oral Q6H PRN Roselle Locus, MD       Or   LORazepam (ATIVAN) injection 1 mg  1 mg Intramuscular Q6H PRN Hill, Shelbie Hutching, MD       magnesium hydroxide (MILK OF MAGNESIA) suspension 15 mL  15 mL Oral Daily PRN Nwoko, Uchenna E, PA       melatonin tablet 5 mg  5 mg Oral QHS PRN Mason Jim, Kayse Puccini E, MD       nicotine (NICODERM CQ - dosed in mg/24 hours) patch 21 mg  21 mg Transdermal Daily Mason Jim, Jeralyn Nolden E, MD   21 mg at 08/09/21 1701   norethindrone-ethinyl estradiol-FE (LOESTRIN FE) 1-20 MG-MCG per tablet 1 tablet  1 tablet Oral QHS Jackelyn Poling, NP  1 tablet at 08/09/21 2157   topiramate (TOPAMAX) tablet 25 mg  25 mg Oral BID Roselle Locus, MD   25 mg at 08/09/21 1628   venlafaxine XR (EFFEXOR-XR) 24 hr capsule 75 mg  75 mg Oral Q breakfast Comer Locket, MD       Vitamin D (Ergocalciferol) (DRISDOL) capsule 50,000 Units  50,000 Units Oral Q7 days Comer Locket, MD   50,000 Units at 08/08/21 1820    Lab Results:  No results found for this or any previous visit (from the past 48 hour(s)).   Blood Alcohol level:  Lab Results  Component Value Date   ETH <10 08/06/2021   ETH 175 (H) 03/19/2019    Metabolic Disorder Labs: Lab Results  Component Value Date   HGBA1C 5.6 08/08/2021   MPG 114.02 08/08/2021   MPG 126 (H) 12/16/2014   No results found for: PROLACTIN No results found for: CHOL, TRIG, HDL, CHOLHDL, VLDL, LDLCALC  Physical Findings:  Musculoskeletal: Strength & Muscle Tone: within normal limits Gait & Station: normal Patient leans: N/A  Psychiatric Specialty Exam:  Presentation  General Appearance: Casually dressed, adequate hygiene  Eye Contact:Fair  Speech:Normal rate and fluency  Speech Volume:Normal  Mood and Affect  Mood:described as improved - mildly anxious appearing  Affect:more constricted today   Thought Process  Thought Processes:Linear, goal directed  Descriptions of  Associations:Intact  Orientation:Full (Time, Place and Person)  Thought Content:Denies SI, HI, AVH, paranoia, ideas of reference or first rank symptoms  Hallucinations:None  Ideas of Reference:None  Suicidal Thoughts:Denied  Homicidal Thoughts:Denied   Sensorium  Memory:Good  Judgment:Fair   Insight:Fair   Executive Functions  Concentration:Good  Attention Span:Good  Recall:Good  Fund of Knowledge:Good  Language:Good   Psychomotor Activity  Psychomotor Activity:Normal   Assets  Assets:Communication Skills; Vocational/Educational   Sleep  5.25 hours   Physical Exam Vitals and nursing note reviewed.  Constitutional:      Appearance: Normal appearance.  HENT:     Head: Normocephalic.  Pulmonary:     Effort: Pulmonary effort is normal.  Neurological:     General: No focal deficit present.     Mental Status: She is alert.   Review of Systems  Respiratory:  Negative for shortness of breath.   Cardiovascular:  Negative for chest pain.  Gastrointestinal:  Negative for constipation, diarrhea, nausea and vomiting.       Menstrual cramps  Neurological:  Negative for headaches.  Blood pressure 124/71, pulse (!) 113, temperature 97.7 F (36.5 C), resp. rate 18, height 5\' 4"  (1.626 m), weight 70.3 kg, last menstrual period 07/08/2021, SpO2 93 %. Body mass index is 26.61 kg/m.   Treatment Plan Summary: Diagnoses / Active Problems: MDD recurrent severe without psychotic features  PLAN: Safety and Monitoring:  -- Voluntary admission to inpatient psychiatric unit for safety, stabilization and treatment  -- Daily contact with patient to assess and evaluate symptoms and progress in treatment  -- Patient's case to be discussed in multi-disciplinary team meeting  -- Observation Level : q15 minute checks  -- Vital signs:  q12 hours  -- Precautions: suicide, elopement, and assault  2. Psychiatric Diagnoses and Treatment:   MDD recurrent severe without  psychotic features  -- Increase Effexor today to 75mg     -- Continue Topamax 25mg  bid for chronic pain and mood stability  -- Continue Melatonin 5mg  po qhs PRN insomnia; holding Trazodone given recent OD on med  -- Continue Vistaril 25mg  tid PRN anxiety  --  Encouraged patient to participate in unit milieu and in scheduled group therapies   -- SW making referrals for PHP    3. Medical Issues Being Addressed:   Tobacco Use Disorder  -- Nicotine patch /24 hours ordered  -- Smoking cessation encouraged   Hypothyroidism  -- Continue Synthroid daily  -- Admission TSH 2.470   Endometriosis  -- Continue home Elagolix sodium  bid   -- Home BC pill ordered  -- Topamax  bid to help with chronic pain   Low Vitamin D  -- start Vit D 50,000IU weekly  4. Discharge Planning:   -- Social work and case management to assist with discharge planning and identification of hospital follow-up needs prior to discharge  -- Estimated LOS: 5-7 days  -- Discharge Concerns: Need to establish a safety plan; Medication compliance and effectiveness  -- Discharge Goals: Return home with outpatient referrals for mental health follow-up including medication management/psychotherapy   Comer Locket, MD, FAPA 08/10/2021, 7:07 AM

## 2021-08-11 ENCOUNTER — Encounter (HOSPITAL_COMMUNITY): Payer: Self-pay

## 2021-08-11 NOTE — Group Note (Signed)
LCSW Group Therapy Note  Group Date: 08/11/2021 Start Time: 1300 End Time: 1400   Type of Therapy and Topic:  Group Therapy - Healthy vs Unhealthy Coping Skills  Participation Level:  Did Not Attend   Description of Group The focus of this group was to determine what unhealthy coping techniques typically are used by group members and what healthy coping techniques would be helpful in coping with various problems. Patients were guided in becoming aware of the differences between healthy and unhealthy coping techniques. Patients were asked to identify 2-3 healthy coping skills they would like to learn to use more effectively.  Therapeutic Goals Patients learned that coping is what human beings do all day long to deal with various situations in their lives Patients defined and discussed healthy vs unhealthy coping techniques Patients identified their preferred coping techniques and identified whether these were healthy or unhealthy Patients determined 2-3 healthy coping skills they would like to become more familiar with and use more often. Patients provided support and ideas to each other   Summary of Patient Progress:  Patient did not attend group despite encouraged participation.    Therapeutic Modalities Cognitive Behavioral Therapy Motivational Interviewing  Larose Kells 08/11/2021  2:06 PM

## 2021-08-11 NOTE — Progress Notes (Signed)
[facility] MD Progress Note  08/11/2021 7:44 AM Rebecca Vaughan  MRN:  [id number]  Chief Complaint: suicide attempt  Reason for Admission:  Rebecca Vaughan is a 21 y.o. female with a history of depression and anxiety, who was initially admitted for inpatient psychiatric hospitalization on 08/06/2021 after intentional OD on Trazodone in the context of psychosocial stressors. The patient is currently on Hospital Day 5.   Chart Review from last 24 hours:  The patient's chart was reviewed and nursing notes were reviewed. The patient's case was discussed in multidisciplinary team meeting. Per nursing, she attended groups and had no acute behavioral issues or safety concerns noted. Per MAR she was compliant with scheduled medications except refusal of Claritin. She did receive Vistaril X1 for anxiety, Motrin X1 for pain, and Melatonin X1 for sleep.  Information Obtained Today During Patient Interview: The patient was seen and evaluated on the unit.  She states she has talked to her mother but their conversations have been tense at times. She feels she has been processing the guilt she feels about her legal issues and denies current ruminations about pending court case. I discussed that I had talked to her mom yesterday and suggested we have a family meeting prior to her discharge. Patient agrees with this plan. She states that her mother is a very religious person and has not previously been supportive when she came out as bi-sexual. She feels her mother wants her to open up to her but then feels betrayed when her mother talks to the patient's aunt about their conversations. She plans to stay with her mother after discharge but does not find her mother to be as nurturing at times as she sometimes needs. We discussed other supports she can look to for comfort and support if needed at times when she is not connecting with her family. Supportive therapy provided. I encouraged her to work on healthier communication and  boundaries with people in her life and to consider family therapy after discharge. She denies SI, HI, AVH, paranoia or delusions. She denies medication side-effects. She states her sleep and appetite are good and her menstrual cramps are improved. She voices no physical complaints. She has been attending groups and agrees to start PHP next week as scheduled.  Principal Problem: MDD (major depressive disorder), severe (HCC) Diagnosis: Principal Problem:   MDD (major depressive disorder), severe (HCC) Active Problems:   Anxiety  Total Time Spent in Direct Patient Care:  I personally spent 40 minutes on the unit in direct patient care. The direct patient care time included face-to-face time with the patient, reviewing the patient's chart, communicating with other professionals, and coordinating care. Greater than 50% of this time was spent in counseling or coordinating care with the patient regarding goals of hospitalization, psycho-education, and discharge planning needs.  Past Psychiatric History: see H&P  Past Medical History:  Past Medical History:  Diagnosis Date   Anxiety    Constipation    COVID 10/2019   fever chills headache x 5 days all symptoms resolved   Depression    Endometriosis    Hypothyroidism    Pneumonia as child age 29   right lung   Thyroid disease    hyperthyroid   Wears glasses     Past Surgical History:  Procedure Laterality Date   LAPAROTOMY N/A 05/06/2020   Procedure: EXPLORATORY LAPAROTOMY, LYSIS OF ADHESIONS, DRAINAGE OF ENDOMETRIOMAS, PARTIAL RIGHT OVARIAN CYSTECTOMY;  Surgeon: Marcelle Overlie, MD;  Location: [facility];  Service: Gynecology;  Laterality: N/A;   Family History:  Family History  Problem Relation Age of Onset   Other Mother    Thyroid disease Mother    Hypertension Father    Diabetes Father    Hypercholesterolemia Father    Hypertension Maternal Grandmother    Hypertension Paternal Grandmother    Diabetes Paternal  Grandmother    Hypertension Paternal Grandfather    Diabetes Paternal Grandfather    Family Psychiatric  History: see H&P  Social History:  Social History   Substance and Sexual Activity  Alcohol Use Not Currently     Social History   Substance and Sexual Activity  Drug Use Not Currently    Social History   Socioeconomic History   Marital status: Single    Spouse name: Not on file   Number of children: 0   Years of education: 13   Highest education level: Some college, no degree  Occupational History   Not on file  Tobacco Use   Smoking status: Never   Smokeless tobacco: Never   Tobacco comments:    No smokers at home  Vaping Use   Vaping Use: Never used  Substance and Sexual Activity   Alcohol use: Not Currently   Drug use: Not Currently   Sexual activity: Yes    Birth control/protection: Pill  Other Topics Concern   Not on file  Social History Narrative   Is in 8th grade at [employer]   Patient  currently attends [employer]. 08/06/2021   Social Determinants of Health   Financial Resource Strain: Not on file  Food Insecurity: Not on file  Transportation Needs: Not on file  Physical Activity: Not on file  Stress: Not on file  Social Connections: Not on file    Sleep: Good  Appetite:  Good  Current Medications: Current Facility-Administered Medications  Medication Dose Route Frequency Provider Last Rate Last Admin   acetaminophen (TYLENOL) tablet 650 mg  650 mg Oral Q6H PRN Nwoko, Uchenna E, PA   650 mg at 08/06/21 2152   alum & mag hydroxide-simeth (MAALOX/MYLANTA) 200-200-20 MG/5ML suspension 15 mL  15 mL Oral Q4H PRN Nwoko, Uchenna E, PA       Elagolix Sodium TABS 200 mg  200 mg Oral BID Daune Divirgilio E, MD   200 mg at 08/10/21 1644   feeding supplement (ENSURE ENLIVE / ENSURE PLUS) liquid 237 mL  237 mL Oral BID BM Massengill, Nathan, MD   237 mL at 08/10/21 1615   hydrOXYzine (ATARAX) tablet 25 mg  25 mg Oral TID PRN Nwoko, Uchenna E, PA    25 mg at 08/10/21 2117   ibuprofen (ADVIL) tablet 600 mg  600 mg Oral Q6H PRN Comer Locket, MD   600 mg at 08/10/21 2800   levothyroxine (SYNTHROID) tablet 100 mcg  100 mcg Oral Q0600 Comer Locket, MD   100 mcg at 08/11/21 0625   loratadine (CLARITIN) tablet 10 mg  10 mg Oral Daily Nira Conn A, NP   10 mg at 08/08/21 0805   LORazepam (ATIVAN) tablet 1 mg  1 mg Oral Q6H PRN Roselle Locus, MD       Or   LORazepam (ATIVAN) injection 1 mg  1 mg Intramuscular Q6H PRN Hill, Shelbie Hutching, MD       magnesium hydroxide (MILK OF MAGNESIA) suspension 15 mL  15 mL Oral Daily PRN Nwoko, Uchenna E, PA       melatonin tablet 5 mg  5 mg Oral QHS PRN Bartholomew CrewsSingleton, Stedman [place] E, MD   5 mg at 08/10/21 2117   nicotine (NICODERM CQ - dosed in mg/24 hours) patch 21 mg  21 mg Transdermal Daily Mason JimSingleton, Shiesha Jahn E, MD   21 mg at 08/09/21 1701   norethindrone-ethinyl estradiol-FE (LOESTRIN FE) 1-20 MG-MCG per tablet 1 tablet  1 tablet Oral QHS Nira ConnBerry, Jason A, NP   1 tablet at 08/10/21 2200   topiramate (TOPAMAX) tablet 25 mg  25 mg Oral BID Roselle LocusHill, Stephanie Leigh, MD   25 mg at 08/10/21 1643   venlafaxine XR (EFFEXOR-XR) 24 hr capsule 75 mg  75 mg Oral Q breakfast Comer LocketSingleton, Kenishia Plack E, MD   75 mg at 08/10/21 16100821   Vitamin D (Ergocalciferol) (DRISDOL) capsule 50,000 Units  50,000 Units Oral Q7 days Comer LocketSingleton, Emanii Bugbee E, MD   50,000 Units at 08/08/21 1820    Lab Results:  No results found for this or any previous visit (from the past 48 hour(s)).   Blood Alcohol level:  Lab Results  Component Value Date   ETH <10 08/06/2021   ETH 175 (H) 03/19/2019    Metabolic Disorder Labs: Lab Results  Component Value Date   HGBA1C 5.6 08/08/2021   MPG 114.02 08/08/2021   MPG 126 (H) 12/16/2014   No results found for: PROLACTIN No results found for: CHOL, TRIG, HDL, CHOLHDL, VLDL, LDLCALC  Physical Findings:  Musculoskeletal: Strength & Muscle Tone: within normal limits Gait & Station: normal Patient leans:  N/A  Psychiatric Specialty Exam:  Presentation  General Appearance: Casually dressed, adequate hygiene  Eye Contact:Good  Speech:Normal rate and fluency  Speech Volume:Normal  Mood and Affect  Mood:described as improved - mildly anxious appearing at times  Affect:mildly anxious affect when discussing stressors; overall brighter   Thought Process  Thought Processes:Linear, goal directed  Descriptions of Associations:Intact  Orientation:Full (Time, Place and Person)  Thought Content:Denies SI, HI, AVH, paranoia, ideas of reference or first rank symptoms  Hallucinations:None  Ideas of Reference:None  Suicidal Thoughts:Denied  Homicidal Thoughts:Denied   Sensorium  Memory:Good  Judgment:Fair   Insight:Fair   Executive Functions  Concentration:Good  Attention Span:Good  Recall:Good  Fund of Knowledge:Good  Language:Good   Psychomotor Activity  Psychomotor Activity:Normal   Assets  Assets:Communication Skills; Vocational/Educational   Sleep  5.25 hours   Physical Exam Vitals and nursing note reviewed.  Constitutional:      Appearance: Normal appearance.  HENT:     Head: Normocephalic.  Pulmonary:     Effort: Pulmonary effort is normal.  Neurological:     General: No focal deficit present.     Mental Status: She is alert.   Review of Systems  Respiratory:  Negative for shortness of breath.   Cardiovascular:  Negative for chest pain.  Gastrointestinal:  Negative for constipation, diarrhea, nausea and vomiting.  Neurological:  Negative for headaches.  Blood pressure 114/86, pulse (!) 104, temperature 99.1 F (37.3 C), temperature source Oral, resp. rate 16, height 5\' 4"  (1.626 m), weight 70.3 kg, last menstrual period 07/08/2021, SpO2 97 %. Body mass index is 26.61 kg/m.   Treatment Plan Summary: Diagnoses / Active Problems: MDD recurrent severe without psychotic features  PLAN: Safety and Monitoring:  -- Voluntary admission to  inpatient psychiatric unit for safety, stabilization and treatment  -- Daily contact with patient to assess and evaluate symptoms and progress in treatment  -- Patient's case to be discussed in multi-disciplinary team meeting  -- Observation Level : q15 minute checks  --  Vital signs:  q12 hours  -- Precautions: suicide, elopement, and assault  2. Psychiatric Diagnoses and Treatment:   MDD recurrent severe without psychotic features  -- Continue Effexor 75mg     -- Continue Topamax 25mg  bid for chronic pain and mood stability  -- Continue Melatonin 5mg  po qhs PRN insomnia; holding Trazodone given recent OD on med  -- Continue Vistaril 25mg  tid PRN anxiety  -- Encouraged patient to participate in unit milieu and in scheduled group therapies   -- PHP due to start 08/16/21  -- Anticipate d/c tomorrow with family meeting prior to discharge    3. Medical Issues Being Addressed:   Tobacco Use Disorder  -- Nicotine patch 21mg /24 hours ordered  -- Smoking cessation encouraged   Hypothyroidism  -- Continue Synthroid daily  -- Admission TSH 2.470   Endometriosis  -- Continue home Elagolix sodium 200mg  bid   -- Home BC pill ordered  -- Topamax 25mg  bid to help with chronic pain   Low Vitamin D  -- start Vit D 50,000IU weekly  4. Discharge Planning:   -- Social work and case management to assist with discharge planning and identification of hospital follow-up needs prior to discharge  -- Estimated LOS: 5-7 days  -- Discharge Concerns: Need to establish a safety plan; Medication compliance and effectiveness  -- Discharge Goals: Return home with outpatient referrals for mental health follow-up including medication management/psychotherapy   , MD, FAPA 08/11/2021, 7:44 AM

## 2021-08-11 NOTE — Progress Notes (Signed)
   08/11/21 1000  Psych Admission Type (Psych Patients Only)  Admission Status Voluntary  Psychosocial Assessment  Patient Complaints Anxiety  Eye Contact Fair  Facial Expression Animated  Affect Anxious  Speech Logical/coherent  Interaction Assertive  Motor Activity Other (Comment) (WDL)  Appearance/Hygiene Unremarkable  Behavior Characteristics Cooperative;Appropriate to situation  Mood Anxious  Thought Process  Coherency WDL  Content WDL  Delusions None reported or observed  Perception WDL  Hallucination None reported or observed  Judgment Poor  Confusion WDL  Danger to Self  Current suicidal ideation? Denies  Self-Injurious Behavior No self-injurious ideation or behavior indicators observed or expressed   Agreement Not to Harm Self Yes  Description of Agreement Verbal  Danger to Others  Danger to Others None reported or observed

## 2021-08-11 NOTE — BH IP Treatment Plan (Signed)
Interdisciplinary Treatment and Diagnostic Plan Update  08/11/2021 Time of Session: 0830 Rebecca Vaughan MRN: [id number]  Principal Diagnosis: MDD (major depressive disorder), severe (HCC)  Secondary Diagnoses: Principal Problem:   MDD (major depressive disorder), severe (HCC) Active Problems:   Anxiety   Current Medications:  Current Facility-Administered Medications  Medication Dose Route Frequency Provider Last Rate Last Admin   acetaminophen (TYLENOL) tablet 650 mg  650 mg Oral Q6H PRN Nwoko, Uchenna E, PA   650 mg at 08/11/21 0746   alum & mag hydroxide-simeth (MAALOX/MYLANTA) 200-200-20 MG/5ML suspension 15 mL  15 mL Oral Q4H PRN Nwoko, Uchenna E, PA       Elagolix Sodium TABS 200 mg  200 mg Oral BID Singleton, Amy E, MD   200 mg at 08/11/21 0744   feeding supplement (ENSURE ENLIVE / ENSURE PLUS) liquid 237 mL  237 mL Oral BID BM Massengill, Harrold DonathNathan, MD   237 mL at 08/11/21 1112   hydrOXYzine (ATARAX) tablet 25 mg  25 mg Oral TID PRN Nwoko, Uchenna E, PA   25 mg at 08/10/21 2117   ibuprofen (ADVIL) tablet 600 mg  600 mg Oral Q6H PRN Comer LocketSingleton, Amy E, MD   600 mg at 08/10/21 [id number]   levothyroxine (SYNTHROID) tablet 100 mcg  100 mcg Oral Q0600 Comer LocketSingleton, Amy E, MD   100 mcg at 08/11/21 [id number]   loratadine (CLARITIN) tablet 10 mg  10 mg Oral Daily Nira ConnBerry, Jason A, NP   10 mg at 08/08/21 0805   LORazepam (ATIVAN) tablet 1 mg  1 mg Oral Q6H PRN Roselle LocusHill, Stephanie Leigh, MD       Or   LORazepam (ATIVAN) injection 1 mg  1 mg Intramuscular Q6H PRN Hill, Shelbie HutchingStephanie Leigh, MD       magnesium hydroxide (MILK OF MAGNESIA) suspension 15 mL  15 mL Oral Daily PRN Nwoko, Uchenna E, PA       melatonin tablet 5 mg  5 mg Oral QHS PRN Bartholomew CrewsSingleton, Amy E, MD   5 mg at 08/10/21 2117   nicotine (NICODERM CQ - dosed in mg/24 hours) patch 21 mg  21 mg Transdermal Daily Mason JimSingleton, Amy E, MD   21 mg at 08/09/21 1701   norethindrone-ethinyl estradiol-FE (LOESTRIN FE) 1-20 MG-MCG per tablet 1 tablet  1 tablet Oral QHS  Nira ConnBerry, Jason A, NP   1 tablet at 08/10/21 2200   topiramate (TOPAMAX) tablet 25 mg  25 mg Oral BID Roselle LocusHill, Stephanie Leigh, MD   25 mg at 08/11/21 0745   venlafaxine XR (EFFEXOR-XR) 24 hr capsule 75 mg  75 mg Oral Q breakfast Comer LocketSingleton, Amy E, MD   75 mg at 08/11/21 0745   Vitamin D (Ergocalciferol) (DRISDOL) capsule 50,000 Units  50,000 Units Oral Q7 days Comer LocketSingleton, Amy E, MD   50,000 Units at 08/08/21 1820   PTA Medications: Medications Prior to Admission  Medication Sig Dispense Refill Last Dose   Elagolix Sodium (ORILISSA) 200 MG TABS Take 200 mg by mouth in the morning and at bedtime.   08/05/2021   escitalopram (LEXAPRO) 10 MG tablet Take 10 mg by mouth daily.   08/05/2021   ketorolac (TORADOL) 10 MG tablet Take 10 mg by mouth every 6 (six) hours as needed (For menstrual pain).   last month   LARIN FE 1/20 1-20 MG-MCG tablet Take 1 tablet by mouth daily.   08/05/2021   levothyroxine (SYNTHROID) 100 MCG tablet Take 100 mcg by mouth daily.   08/05/2021   traZODone (DESYREL) 50 MG  tablet Take 50 mg by mouth at bedtime as needed for sleep.   08/06/2021    Patient Stressors: Educational concerns   Financial difficulties   Legal issue   Substance abuse    Patient Strengths: Ability for insight  Average or above average intelligence  Motivation for treatment/growth  Supportive family/friends  Work skills   Treatment Modalities: Medication Management, Group therapy, Case management,  1 to 1 session with clinician, Psychoeducation, Recreational therapy.   Physician Treatment Plan for Primary Diagnosis: MDD (major depressive disorder), severe (HCC) Long Term Goal(s): Improvement in symptoms so as ready for discharge   Short Term Goals: Ability to identify changes in lifestyle to reduce recurrence of condition will improve Ability to verbalize feelings will improve Ability to disclose and discuss suicidal ideas Ability to demonstrate self-control will improve Ability to identify and develop  effective coping behaviors will improve Ability to maintain clinical measurements within normal limits will improve Compliance with prescribed medications will improve  Medication Management: Evaluate patient's response, side effects, and tolerance of medication regimen.  Therapeutic Interventions: 1 to 1 sessions, Unit Group sessions and Medication administration.  Evaluation of Outcomes: Progressing  Physician Treatment Plan for Secondary Diagnosis: Principal Problem:   MDD (major depressive disorder), severe (HCC) Active Problems:   Anxiety  Long Term Goal(s): Improvement in symptoms so as ready for discharge   Short Term Goals: Ability to identify changes in lifestyle to reduce recurrence of condition will improve Ability to verbalize feelings will improve Ability to disclose and discuss suicidal ideas Ability to demonstrate self-control will improve Ability to identify and develop effective coping behaviors will improve Ability to maintain clinical measurements within normal limits will improve Compliance with prescribed medications will improve     Medication Management: Evaluate patient's response, side effects, and tolerance of medication regimen.  Therapeutic Interventions: 1 to 1 sessions, Unit Group sessions and Medication administration.  Evaluation of Outcomes: Progressing   RN Treatment Plan for Primary Diagnosis: MDD (major depressive disorder), severe (HCC) Long Term Goal(s): Knowledge of disease and therapeutic regimen to maintain health will improve  Short Term Goals: Ability to remain free from injury will improve, Ability to verbalize frustration and anger appropriately will improve, Ability to demonstrate self-control, Ability to participate in decision making will improve, Ability to verbalize feelings will improve, Ability to disclose and discuss suicidal ideas, Ability to identify and develop effective coping behaviors will improve, and Compliance with  prescribed medications will improve  Medication Management: RN will administer medications as ordered by provider, will assess and evaluate patient's response and provide education to patient for prescribed medication. RN will report any adverse and/or side effects to prescribing provider.  Therapeutic Interventions: 1 on 1 counseling sessions, Psychoeducation, Medication administration, Evaluate responses to treatment, Monitor vital signs and CBGs as ordered, Perform/monitor CIWA, COWS, AIMS and Fall Risk screenings as ordered, Perform wound care treatments as ordered.  Evaluation of Outcomes: Progressing   LCSW Treatment Plan for Primary Diagnosis: MDD (major depressive disorder), severe (HCC) Long Term Goal(s): Safe transition to appropriate next level of care at discharge, Engage patient in therapeutic group addressing interpersonal concerns.  Short Term Goals: Engage patient in aftercare planning with referrals and resources, Increase social support, Increase ability to appropriately verbalize feelings, Increase emotional regulation, Facilitate acceptance of mental health diagnosis and concerns, Facilitate patient progression through stages of change regarding substance use diagnoses and concerns, Identify triggers associated with mental health/substance abuse issues, and Increase skills for wellness and recovery  Therapeutic Interventions: Assess  for all discharge needs, 1 to 1 time with Child psychotherapist, Explore available resources and support systems, Assess for adequacy in community support network, Educate family and significant other(s) on suicide prevention, Complete Psychosocial Assessment, Interpersonal group therapy.  Evaluation of Outcomes: Progressing   Progress in Treatment: Attending groups: Yes. Participating in groups: Yes. Taking medication as prescribed: Yes. Toleration medication: Yes. Family/Significant other contact made: Yes, individual(s) contacted:  SPE completed  with Ray Church, mother.  Patient understands diagnosis: Yes. Discussing patient identified problems/goals with staff: Yes. Medical problems stabilized or resolved: Yes. Denies suicidal/homicidal ideation: No. Issues/concerns per patient self-inventory: Yes. Other: none   New problem(s) identified: No, Describe:  No additional problems/concerns identified at this time.   New Short Term/Long Term Goal(s): Patient to work towards medication management for mood stabilization; elimination of SI thoughts; development of comprehensive mental wellness plan.  Patient Goals: No additional goals identified at this time. Patient to continue to work towards original goals identified in initial treatment team meeting. CSW will remain available to patient should they voice additional treatment goals.   Discharge Plan or Barriers: No psychosocial barriers identified at this time, patient to return to place of residence with mother when appropriate for discharge.   Reason for Continuation of Hospitalization: Depression Suicidal ideation  Estimated Length of Stay: 1-7 days  Last 3 [place] Suicide Severity Risk Score: Flowsheet Row [facility] (Current[facility] Most recent reading at 08/09/2021  1:06 PM ED from 08/06/2021 in [facility] EM[facility]GENCY DEPARTMENT Most recent reading at 08/06/2021  6:48 AM ED to Hosp-Admission (Discharged) from 12/31/2020 in [facility]  SURGICAL Most recent reading at 12/31/2020  5:19 PM  C-SSRS RISK CATEGORY No Risk High Risk No Risk       Last PHQ 2/9 Scores:    08/06/2021    1:38 PM  Depression screen PHQ 2/9  Decreased Interest 3  Down, Depressed, Hopeless 3  PHQ - 2 Score 6  Altered sleeping 1  Tired, decreased energy 0  Change in appetite 0  Feeling bad or failure about yourself  3  Trouble concentrating 0  Moving slowly or fidgety/restless 0  Suicidal thoughts 1   PHQ-9 Score 11  Difficult doing work/chores Somewhat difficult    Scribe for Treatment Team: Almedia Balls 08/11/2021 11:51 AM

## 2021-08-11 NOTE — Progress Notes (Signed)
Patient compliant with medications denies SI/HI/A/VH. Q 15 minutes safety checks ongoing without self harm gestures.

## 2021-08-11 NOTE — Group Note (Signed)
Date:  08/11/2021 Time:  9:53 AM  Group Topic/Focus:  Orientation:   The focus of this group is to educate the patient on the purpose and policies of crisis stabilization and provide a format to answer questions about their admission.  The group details unit policies and expectations of patients while admitted.    Participation Level:  Active  Participation Quality:  Appropriate  Affect:  Appropriate  Cognitive:  Appropriate  Insight: Appropriate  Engagement in Group:  Engaged  Modes of Intervention:  Discussion  Additional Comments:    Jaquita Rector 08/11/2021, 9:53 AM

## 2021-08-11 NOTE — Group Note (Signed)
Recreation Therapy Group Note   Group Topic:Stress Management  Group Date: 08/11/2021 Start Time: 0930 End Time: 0945 Facilitators: Caroll Rancher, [place] Location: [address] Dayroom   Goal Area(s) Addresses:  Patient will identify positive stress management techniques. Patient will identify benefits of using stress management post d/c.  Group Description:  Meditation.  LRT explained the stress management technique of meditation to patients.  LRT then played a meditation that focused on bringing positive energy into your day through positive self talk.  Patients were to listen as meditation played and quietly repeat the affirmations to themselves to get the full affect of the exercise.  At conclusion, LRT made suggestions of where patients could find more meditations and other stress management techniques.   Affect/Mood: N/A   Participation Level: Did not attend    Clinical Observations/Individualized Feedback:     Plan: Continue to engage patient in RT group sessions 2-3x/week.   Caroll Rancher, LRT, CTRS 08/11/2021 12:23 PM

## 2021-08-12 MED ORDER — MELATONIN 5 MG PO TABS: 5.0000 mg | ORAL_TABLET | Freq: Every evening | ORAL | 0 refills | Status: AC | PRN

## 2021-08-12 MED ORDER — HYDROXYZINE HCL 25 MG PO TABS
25.0000 mg | ORAL_TABLET | Freq: Three times a day (TID) | ORAL | 0 refills | Status: DC | PRN
Start: 2021-08-12 — End: 2021-09-06

## 2021-08-12 MED ORDER — TOPIRAMATE 25 MG PO TABS: 25.0000 mg | ORAL_TABLET | Freq: Two times a day (BID) | ORAL | 0 refills | Status: DC

## 2021-08-12 MED ORDER — NICOTINE 21 MG/24HR TD PT24: 21.0000 mg | MEDICATED_PATCH | Freq: Every day | TRANSDERMAL | 0 refills | Status: AC | PRN

## 2021-08-12 MED ORDER — VITAMIN D (ERGOCALCIFEROL) 1.25 MG (50000 UNIT) PO CAPS: 50000.0000 [IU] | ORAL_CAPSULE | ORAL | 0 refills | Status: AC

## 2021-08-12 MED ORDER — VENLAFAXINE HCL ER 75 MG PO CP24
75.0000 mg | ORAL_CAPSULE | Freq: Every day | ORAL | 0 refills | Status: DC
Start: 2021-08-12 — End: 2021-09-06

## 2021-08-12 NOTE — Progress Notes (Signed)
  [facility] Adult Case Management Discharge Plan :  Will you be returning to the same living situation after discharge:  Yes,  with mother At discharge, do you have transportation home?: Yes,  with mother Do you have the ability to pay for your medications: Yes,  insurance  Release of information consent forms completed and emailed to Medical Records, then turned in to Medical Records by CSW.   Patient to Follow up at:  Follow-up Information     [facility] Follow up on 08/17/2021.   Specialty: Behavioral Health Why: You have an appointment for therapy services on 08/17/21 at 9:00 am (Virtual telehealth)  You also have an appointment for medication management services on 08/28/21 at 2:00 pm (this appointment will be Virtual). Contact information: [address] [address] [phone]         BEHAVIORAL HEALTH PARTIAL HOSPITALIZATION PROGRAM Follow up on 08/16/2021.   Specialty: Behavioral Health Why: You are scheduled for an assessment for the PHP on Wednesday, 08/16/21 at 10:00 am. This appointment will last approximately one hour and will be virtual via Webex. PHP is virtual group therapy that runs Mon-Fri from 9am-1pm. Please download the [employer] app prior to the appointment. If you need to cancel or reschedule, please call [phone] Contact information: [address] [place] [address] [phone]        [facility]. Call.   Why: You may contact this outpatient  provider for additional information on in-person PHP services. Contact information: Address: [address], [place], [place] [address]  Phone: [phone]                Next level of care provider has access to [facility] Link:no  Safety Planning and Suicide Prevention discussed: Yes,  with mother (doctor is also holding a family meeting with patient and mother prior to discharge)     Has patient  been referred to the Quitline?: N/A patient is not a smoker  Patient has been referred for addiction treatment: N/A  Lynnell Chad, LCSW 08/12/2021, 9:03 AM

## 2021-08-12 NOTE — BHH Group Notes (Signed)
Psychoeducational Group Note  Date: 08/12/2021 Time: 0900-1000    Goal Setting   Purpose of Group: This group helps to provide patients with the steps of setting a goal that is specific, measurable, attainable, realistic and time specific. A discussion on how we keep ourselves stuck with negative self talk. Homework given for Patients to write 30 positive attributes about themselves.    Participation Level:  Active  Participation Quality:  Appropriate  Affect:  Appropriate  Cognitive:  Appropriate  Insight:  Improving  Engagement in Group:  Engaged  Additional Comments:  Rates her energy at a 5/10. Participated in the group.  Dione Housekeeper

## 2021-08-12 NOTE — BHH Suicide Risk Assessment (Signed)
[facility] Discharge Suicide Risk Assessment   Principal Problem: MDD (major depressive disorder), severe ([place]) Discharge Diagnoses: Principal Problem:   MDD (major depressive disorder), severe ([place]) Active Problems:   Anxiety disorder, unspecified  Subjective: Patient was seen on rounds. She reports that she drank coffee this morning which is unusual for her and had an episode of emesis. She plans to try to eat breakfast. She was offered PRN Zofran if needed but states nausea is resolving. She voices no other physical complaints. She states her mood is stable and improved.  She denies SI, HI, AVH, paranoia or delusions. She is forward thinking and discussed plans for family meeting with her mother later this afternoon prior to discharge. She can articulate a safety and discharge plan. She reports stable sleep and appetite. We again discussed her need for close monitoring for SI given FDA black box warnings for use of an antidepressant in her age range. We dicussed that Topamax was added on admission for help with chronic pain as well as for mood stabilization but she was cautioned it is an appetite suppressant. We discussed her low Vitamin D and need for f/u with PCP after discharge for continued monitoring while on Vit D supplement. She was encouraged to abstain from alcohol and illicit drug use. Time was given for questions.   Total Time Spent in Direct Patient Care:  I personally spent 35 minutes on the unit in direct patient care. The direct patient care time included face-to-face time with the patient, reviewing the patient's chart, communicating with other professionals, and coordinating care. Greater than 50% of this time was spent in counseling or coordinating care with the patient regarding goals of hospitalization, psycho-education, and discharge planning needs.   Musculoskeletal: Strength & Muscle Tone: within normal limits Gait & Station: normal Patient leans: N/A  Psychiatric Specialty  Exam  Presentation  General Appearance: casually dressed, good hygiene  Eye Contact:Good  Speech:Normal Rate  Speech Volume:Normal  Mood and Affect  Mood:described as improved - appears mildly anxious  Affect:mildly anxious appearing   Thought Process  Thought Processes:Linear, goal directed  Descriptions of Associations:Intact  Orientation:Full (Time, Place and Person)  Thought Content:Denies SI, HI, AVH, paranoia or delusions  Hallucinations:Denied  Ideas of Reference:Denied  Suicidal Thoughts:Denied  Homicidal Thoughts:Denied  Sensorium  Memory: Good  Judgment:Improved  Insight:Improved    Executive Functions  Concentration:Good  Attention Span:Good  [place] of Knowledge:Good  Language:Good   Psychomotor Activity  Psychomotor Activity:Normal  Assets  Assets:Communication Skills; Vocational/Educational   Sleep  5.75 hours  Physical Exam: Physical Exam Vitals reviewed.  Constitutional:      Appearance: Normal appearance.  HENT:     Head: Normocephalic.  Pulmonary:     Effort: Pulmonary effort is normal.  Neurological:     General: No focal deficit present.     Mental Status: She is alert.   Review of Systems  Respiratory:  Negative for shortness of breath.   Cardiovascular:  Negative for chest pain.  Gastrointestinal:  Positive for nausea and vomiting. Negative for abdominal pain, constipation and diarrhea.  Neurological:  Negative for headaches.  Blood pressure 125/70, pulse 96, temperature 98.1 F (36.7 C), temperature source Oral, resp. rate 16, height 5\' 4"  (1.626 m), weight 70.3 kg, last menstrual period 07/08/2021, SpO2 100 %. Body mass index is 26.61 kg/m.  Mental Status Per Nursing Assessment::   On Admission:  Suicidal ideation indicated by patient, Self-harm thoughts - resolved  Demographic Factors:  Adolescent or young adult,  unemployed  Loss Factors: Scientist, research (physical sciences) issues, financial stressors  Historical  Factors: Family history of suicide, Family history of mental illness or substance abuse, Impulsivity, and previous psychiatric diagnoses treatment  Risk Reduction Factors:   Sense of responsibility to family, Living with another person, especially a relative, Positive social support, and Positive coping skills or problem solving skills  Continued Clinical Symptoms:  Depression:   Impulsivity Previous Psychiatric Diagnoses and Treatments  Cognitive Features That Contribute To Risk:  None    Suicide Risk:  Mild:  There are no identifiable plans, no associated intent, mild dysphoria and related symptoms, some other risk factors including recent attempt, and identifiable protective factors, including available and accessible social support.   Follow-up Information     [facility] Follow up on 08/17/2021.   Specialty: Behavioral Health Why: You have an appointment for therapy services on 08/17/21 at 9:00 am (Virtual telehealth)  You also have an appointment for medication management services on 08/28/21 at 2:00 pm (this appointment will be Virtual). Contact information: [place] [address] [phone]         BEHAVIORAL HEALTH PARTIAL HOSPITALIZATION PROGRAM Follow up on 08/16/2021.   Specialty: Behavioral Health Why: You are scheduled for an assessment for the PHP on Wednesday, 08/16/21 at 10:00 am. This appointment will last approximately one hour and will be virtual via Webex. PHP is virtual group therapy that runs Mon-Fri from 9am-1pm. Please download the [employer] app prior to the appointment. If you need to cancel or reschedule, please call [phone] Contact information: [place] [phone]        [facility]. Call.   Why: You may contact this outpatient  provider for additional information on in-person PHP services. Contact information: Address:  [address], [place], [place] [address]  Phone: [phone]                Plan Of Care/Follow-up recommendations:  Activity:  as tolerated Diet:  heart healthy Other:  Patient advised to comply with scheduled medications and to start partial hospital program next week as scheduled. She was encouraged to abstain from alcohol and illicit drug use. She was advised to see her primary care provider for management of her low Vitamin D. Smoking cessation encouraged.  Harlow Asa, MD, FAPA 08/12/2021, 7:10 AM

## 2021-08-12 NOTE — Discharge Summary (Signed)
Physician Discharge Summary Note  Patient:  Rebecca Vaughan is an 21 y.o., female MRN:  [id number] DOB:  12-Oct-2000 Patient phone:  [phone] (home)  Patient address:   [address] [place] [id number]  Total Time Spent in Direct Patient Care:  I personally spent 75 minutes on the unit in direct patient care. The direct patient care time included face-to-face time with the patient, reviewing the patient's chart, communicating with other professionals, and coordinating care. Greater than 50% of this time was spent in counseling or coordinating care with the patient regarding goals of hospitalization, psycho-education, and discharge planning needs.   Date of Admission:  08/06/2021 Date of Discharge: 08/12/2021  Reason for Admission:  Rebecca Vaughan is a 21 YO F with a history of depression and anxiety who presents s/p intentional overdose on trazodone following a series of financial, family, employment and legal stressors. See H&P for details.  Principal Problem: MDD (major depressive disorder), severe ([facility]) Discharge Diagnoses: Principal Problem:   MDD (major depressive disorder), severe ([facility]) Active Problems:   Anxiety disorder, unspecified   Past Psychiatric History: see H&P  Past Medical History:  Past Medical History:  Diagnosis Date   Anxiety    Constipation    COVID 10/2019   fever chills headache x 5 days all symptoms resolved   Depression    Endometriosis    Hypothyroidism    Pneumonia as child age 71   right lung   Thyroid disease    hyperthyroid   Wears glasses     Past Surgical History:  Procedure Laterality Date   LAPAROTOMY N/A 05/06/2020   Procedure: EXPLORATORY LAPAROTOMY, LYSIS OF ADHESIONS, DRAINAGE OF ENDOMETRIOMAS, PARTIAL RIGHT OVARIAN CYSTECTOMY;  Surgeon: Marcelle OverlieGrewal, Michelle, MD;  [facility];  Service: Gynecology;  Laterality: N/A;   Family History:  Family History  Problem Relation Age of Onset   Other Mother     Thyroid disease Mother    Hypertension Father    Diabetes Father    Hypercholesterolemia Father    Hypertension Maternal Grandmother    Hypertension Paternal Grandmother    Diabetes Paternal Grandmother    Hypertension Paternal Grandfather    Diabetes Paternal Grandfather    Family Psychiatric  History: see H&P  Social History:  Social History   Substance and Sexual Activity  Alcohol Use Not Currently     Social History   Substance and Sexual Activity  Drug Use Not Currently    Social History   Socioeconomic History   Marital status: Single    Spouse name: Not on file   Number of children: 0   Years of education: 13   Highest education level: Some college, no degree  Occupational History   Not on file  Tobacco Use   Smoking status: Never   Smokeless tobacco: Never   Tobacco comments:    No smokers at home  Vaping Use   Vaping Use: Never used  Substance and Sexual Activity   Alcohol use: Not Currently   Drug use: Not Currently   Sexual activity: Yes    Birth control/protection: Pill  Other Topics Concern   Not on file  Social History Narrative   Is in 8th grade at [employer]   Patient  currently attends [employer]. 08/06/2021   Social Determinants of Health   Financial Resource Strain: Not on file  Food Insecurity: Not on file  Transportation Needs: Not on file  Physical Activity: Not on file  Stress: Not on file  Social Connections: Not on file    Hospital Course:  The patient was admitted voluntarily to [facility] where she was seen daily by attending psychiatrist and her team was discussed daily in multi-disciplinary team meeting. She was monitored on q13min safety checks.   On admission, her home medications of Lexapro and Trazodone were discontinued and she was started on Effexor XR 37.5mg  daily titrating up to  daily prior to discharge. She was also started on Topamax  bid to help with chronic pain and mood stabilization. Her home  medications for endometriosis and hypothyroidism were verified and restarted. She was noted to have low Vitamin D and was started on weekly Vit D 50,000u supplement. She was given Melatonin  qhs for insomnia. She was provided nicotine patch for smoking cessation.   Gradually with medication changes and participation in the milieu she was noted to have mood improvement. She attended groups and had no acute behavioral issues or safety concerns noted during admission. On day of discharge, patient participated in a 1 hour family meeting with her mother and attending psychiatrist. During the meeting, time was spent allowing the patient to discuss ways her mother can be supportive after discharge, discussing improved communication between patient and mother, and in safety planning. Psycho-education was provided for patient and mother regarding her diagnosis and medication regimen. At time of discharge, patient was to engage in [facility] program starting 08/16/21.   On day of discharge, she reported that her mood was stable and improved.  She denied SI, HI, AVH, paranoia or delusions. She was forward thinking could  articulate a safety and discharge plan. She reported stable sleep and appetite. We again discussed her need for close monitoring for SI given FDA black box warnings for use of an antidepressant in her age range. We dicussed that Topamax was added on admission for help with chronic pain as well as for mood stabilization but she was cautioned it is an appetite suppressant. We discussed her low Vitamin D and need for f/u with PCP after discharge for continued monitoring while on Vit D supplement. She was encouraged to abstain from alcohol and illicit drug use. Time was given for questions.   Physical Findings: Musculoskeletal: Strength & Muscle Tone: within normal limits Gait & Station: normal Patient leans: N/A   Psychiatric Specialty Exam   Presentation  General Appearance: casually dressed, good  hygiene   Eye Contact:Good   Speech:Normal Rate   Speech Volume:Normal   Mood and Affect  Mood:described as improved - appears mildly anxious   Affect:mildly anxious appearing     Thought Process  Thought Processes:Linear, goal directed   Descriptions of Associations:Intact   Orientation:Full (Time, Place and Person)   Thought Content:Denies SI, HI, AVH, paranoia or delusions   Hallucinations:Denied   Ideas of Reference:Denied   Suicidal Thoughts:Denied   Homicidal Thoughts:Denied   Sensorium  Memory: Good   Judgment:Improved   Insight:Improved      Executive Functions  Concentration:Good   Attention Span:Good   Recall:Good   Fund of Knowledge:Good   Language:Good     Psychomotor Activity  Psychomotor Activity:Normal   Assets  Assets:Communication Skills; Vocational/Educational     Sleep  5.75 hours   Physical Exam: Physical Exam Vitals reviewed.  Constitutional:      Appearance: Normal appearance.  HENT:     Head: Normocephalic.  Pulmonary:     Effort: Pulmonary effort is normal.  Neurological:     General: No focal deficit present.     Mental  Status: She is alert.    Review of Systems  Respiratory:  Negative for shortness of breath.   Cardiovascular:  Negative for chest pain.  Gastrointestinal:  Positive for nausea and vomiting. Negative for abdominal pain, constipation and diarrhea.  Neurological:  Negative for headaches.  Blood pressure 125/70, pulse 96, temperature 98.1 F (36.7 C), temperature source Oral, resp. rate 16, height 5\' 4"  (1.626 m), weight 70.3 kg, last menstrual period 07/08/2021, SpO2 100 %. Body mass index is 26.61 kg/m.  Social History   Tobacco Use  Smoking Status Never  Smokeless Tobacco Never  Tobacco Comments   No smokers at home   Tobacco Cessation:  A prescription for an FDA-approved tobacco cessation medication provided at discharge   Blood Alcohol level:  Lab Results  Component Value Date    ETH <10 08/06/2021   ETH 175 (H) 03/19/2019    Metabolic Disorder Labs:  Lab Results  Component Value Date   HGBA1C 5.6 08/08/2021   MPG 114.02 08/08/2021   MPG 126 (H) 12/16/2014   No results found for: PROLACTIN No results found for: CHOL, TRIG, HDL, CHOLHDL, VLDL, LDLCALC  See Psychiatric Specialty Exam and Suicide Risk Assessment completed by Attending Physician prior to discharge.  Discharge destination:  Home  Is patient on multiple antipsychotic therapies at discharge:  No   Has Patient had three or more failed trials of antipsychotic monotherapy by history:  No  Recommended Plan for Multiple Antipsychotic Therapies: NA   Allergies as of 08/12/2021       Reactions   Kiwi Extract Other (See Comments)   Lips get black scabs        Medication List     STOP taking these medications    escitalopram 10 MG tablet Commonly known as: LEXAPRO   ketorolac 10 MG tablet Commonly known as: TORADOL   traZODone 50 MG tablet Commonly known as: DESYREL       TAKE these medications      Indication  hydrOXYzine 25 MG tablet Commonly known as: ATARAX Take 1 tablet (25 mg total) by mouth every 8 (eight) hours as needed for anxiety.  Indication: Feeling Anxious   Larin Fe 1/20 1-20 MG-MCG tablet Generic drug: norethindrone-ethinyl estradiol-FE Take 1 tablet by mouth daily.  Indication: Birth Control Treatment   levothyroxine 100 MCG tablet Commonly known as: SYNTHROID Take 100 mcg by mouth daily.  Indication: Underactive Thyroid   melatonin 5 MG Tabs Take 1 tablet (5 mg total) by mouth at bedtime as needed (insomnia).  Indication: Trouble Sleeping   nicotine 21 mg/24hr patch Commonly known as: NICODERM CQ - dosed in mg/24 hours Place 1 patch (21 mg total) onto the skin daily as needed (smoking cessation).  Indication: Nicotine Addiction   Orilissa 200 MG Tabs Generic drug: Elagolix Sodium Take 200 mg by mouth in the morning and at bedtime.  Indication:  Endometriosis   topiramate 25 MG tablet Commonly known as: TOPAMAX Take 1 tablet (25 mg total) by mouth 2 (two) times daily.  Indication: chronic pain, mood stabilization   venlafaxine XR 75 MG 24 hr capsule Commonly known as: EFFEXOR-XR Take 1 capsule (75 mg total) by mouth daily with breakfast.  Indication: Major Depressive Disorder   Vitamin D (Ergocalciferol) 1.25 MG (50000 UNIT) Caps capsule Commonly known as: DRISDOL Take 1 capsule (50,000 Units total) by mouth every 7 (seven) days. Start taking on: Aug 15, 2021  Indication: Vitamin D Deficiency        Follow-up Information  [facility] [place] [facility] Follow up on 08/17/2021.   Specialty: Behavioral Health Why: You have an appointment for therapy services on 08/17/21 at 9:00 am (Virtual telehealth)  You also have an appointment for medication management services on 08/28/21 at 2:00 pm (this appointment will be Virtual). Contact information: [address] [place] [address] [phone]         BEHAVIORAL HEALTH PARTIAL HOSPITALIZATION PROGRAM Follow up on 08/16/2021.   Specialty: Behavioral Health Why: You are scheduled for an assessment for the PHP on Wednesday, 08/16/21 at 10:00 am. This appointment will last approximately one hour and will be virtual via Webex. PHP is virtual group therapy that runs Mon-Fri from 9am-1pm. Please download the [employer] app prior to the appointment. If you need to cancel or reschedule, please call [phone] Contact information: [address] [place] [address] [phone]        [facility]. Call.   Why: You may contact this outpatient  provider for additional information on in-person PHP services. Contact information: Address: [address], [place], [place] [address]  Phone: [phone]                Follow-up recommendations:  Patient advised to comply with scheduled medications  and to start partial hospital program next week as scheduled. She was encouraged to abstain from alcohol and illicit drug use. She was advised to see her primary care provider for management of her low Vitamin D. Smoking cessation encouraged.  Signed: Comer Locket, MD, FAPA 08/12/2021, 6:06 PM

## 2021-08-12 NOTE — Progress Notes (Signed)
Pt discharged to lobby where mother is waiting for her. Pt was stable and appreciative at that time. All papers and prescriptions were given and valuables returned. Verbal understanding expressed. Denies SI/HI and A/VH. Pt given opportunity to express concerns and ask questions.
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
## 2021-08-12 NOTE — Progress Notes (Signed)
Pt is A&OX4, calm, denies suicidal ideations, denies homicidal ideations, denies auditory hallucinations and denies visual hallucinations. Pt verbally agrees to approach staff if these become apparent and before harming self or others. Pt denies experiencing nightmares. Mood and affect are congruent. Pt appetite is good. Pt reports sleeping well. "I slept good." No complaints of anxiety, distress, pain and/or discomfort at this time. Pt's memory appears to be grossly intact, and Pt hasn't displayed any injurious behaviors. Pt is medication compliant. There's no evidence of suicidal intent. Psychomotor activity was WNL. No s/s of Parkinson, Dystonia, Akathisia and/or Tardive Dyskinesia noted.

## 2021-08-12 NOTE — Group Note (Signed)
Date:  08/12/2021 Time:  11:18 AM  Group Topic/Focus:  Orientation:   The focus of this group is to educate the patient on the purpose and policies of crisis stabilization and provide a format to answer questions about their admission.  The group details unit policies and expectations of patients while admitted.    Participation Level:  Active  Participation Quality:  Appropriate  Affect:  Appropriate  Cognitive:  Appropriate  Insight: Appropriate  Engagement in Group:  Engaged  Modes of Intervention:  Discussion  Additional Comments:    Reymundo Poll 08/12/2021, 11:18 AM

## 2021-08-16 ENCOUNTER — Other Ambulatory Visit (HOSPITAL_COMMUNITY): Payer: 59 | Attending: Psychiatry | Admitting: Professional

## 2021-08-16 DIAGNOSIS — F322 Major depressive disorder, single episode, severe without psychotic features: Secondary | ICD-10-CM

## 2021-08-17 ENCOUNTER — Telehealth (HOSPITAL_COMMUNITY): Payer: Self-pay | Admitting: Professional

## 2021-08-21 ENCOUNTER — Other Ambulatory Visit (HOSPITAL_COMMUNITY): Payer: 59 | Attending: Psychiatry | Admitting: Licensed Clinical Social Worker

## 2021-08-21 ENCOUNTER — Other Ambulatory Visit (HOSPITAL_COMMUNITY): Payer: 59

## 2021-08-21 ENCOUNTER — Telehealth (HOSPITAL_COMMUNITY): Payer: Self-pay | Admitting: Professional

## 2021-08-21 ENCOUNTER — Encounter (HOSPITAL_COMMUNITY): Payer: Self-pay

## 2021-08-21 DIAGNOSIS — R4589 Other symptoms and signs involving emotional state: Secondary | ICD-10-CM | POA: Diagnosis not present

## 2021-08-21 DIAGNOSIS — Z638 Other specified problems related to primary support group: Secondary | ICD-10-CM | POA: Diagnosis not present

## 2021-08-21 DIAGNOSIS — Z9151 Personal history of suicidal behavior: Secondary | ICD-10-CM | POA: Insufficient documentation

## 2021-08-21 DIAGNOSIS — F322 Major depressive disorder, single episode, severe without psychotic features: Secondary | ICD-10-CM

## 2021-08-21 DIAGNOSIS — Z658 Other specified problems related to psychosocial circumstances: Secondary | ICD-10-CM | POA: Insufficient documentation

## 2021-08-21 DIAGNOSIS — Z733 Stress, not elsewhere classified: Secondary | ICD-10-CM | POA: Insufficient documentation

## 2021-08-21 DIAGNOSIS — F419 Anxiety disorder, unspecified: Secondary | ICD-10-CM | POA: Diagnosis not present

## 2021-08-21 DIAGNOSIS — Z599 Problem related to housing and economic circumstances, unspecified: Secondary | ICD-10-CM | POA: Diagnosis not present

## 2021-08-21 DIAGNOSIS — Z760 Encounter for issue of repeat prescription: Secondary | ICD-10-CM | POA: Diagnosis not present

## 2021-08-21 DIAGNOSIS — Z79899 Other long term (current) drug therapy: Secondary | ICD-10-CM | POA: Insufficient documentation

## 2021-08-21 NOTE — Plan of Care (Signed)
  Problem: Depression CCP Problem  1  Goal: LTG: Rebecca Vaughan WILL SCORE LESS THAN 10 ON THE PATIENT HEALTH QUESTIONNAIRE (PHQ-9) Outcome: Not Applicable Goal: STG: Rebecca Vaughan WILL ATTEND AT LEAST 80% OF SCHEDULED PHP SESSIONS Outcome: Not Applicable Goal: STG: Rebecca Vaughan WILL COMPLETE AT LEAST 80% OF ASSIGNED HOMEWORK Outcome: Not Applicable Goal: STG: Rebecca Vaughan WILL IDENTIFY AT LEAST 3 COGNITIVE PATTERNS AND BELIEFS THAT SUPPORT DEPRESSION Outcome: Not Applicable   Pt verbally agrees to treatment plan.
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
## 2021-08-21 NOTE — Therapy (Signed)
[facility] [address] [place], [place], [address] Phone: [phone]   Fax:  [phone]  Occupational Therapy Evaluation  Virtual Visit via Video Note  I connected with Rebecca Cornanielle K Kissner on 08/21/21 at  8:00 AM EDT by a video enabled telemedicine application and verified that I am speaking with the correct person using two identifiers.  Location: Patient: home Provider: office   I discussed the limitations of evaluation and management by telemedicine and the availability of in person appointments. The patient expressed understanding and agreed to proceed.    The patient was advised to call back or seek an in-person evaluation if the symptoms worsen or if the condition fails to improve as anticipated.  I provided 100 minutes of non-face-to-face time during this encounter.   Patient Details  Name: Rebecca Vaughan MRN: [id number] Date of Birth: January 14, 2001 No data recorded  Encounter Date: 08/21/2021   OT End of Session - 08/21/21 1840     Visit Number 1    Number of Visits 20    Date for OT Re-Evaluation 09/20/21    Authorization Type AETNA AETNA NAP  [id number] [place]    OT Start Time 0915   0915-1000 - eval (45)   OT Stop Time 1255   1200-1255 - (55) Tx   OT Time Calculation (min) 220 min    Equipment Utilized During Treatment webex /    Activity Tolerance Patient tolerated treatment well             Past Medical History:  Diagnosis Date   Anxiety    Constipation    COVID 10/2019   fever chills headache x 5 days all symptoms resolved   Depression    Endometriosis    Hypothyroidism    Pneumonia as child age 77   right lung   Thyroid disease    hyperthyroid   Wears glasses     Past Surgical History:  Procedure Laterality Date   LAPAROTOMY N/A 05/06/2020   Procedure: EXPLORATORY LAPAROTOMY, LYSIS OF ADHESIONS, DRAINAGE OF ENDOMETRIOMAS, PARTIAL RIGHT OVARIAN  CYSTECTOMY;  Surgeon: Marcelle OverlieGrewal, Michelle, MD;  [facility];  Service: Gynecology;  Laterality: N/A;    There were no vitals filed for this visit.   Subjective Assessment - 08/21/21 1837     Subjective  I want to work on coping skills and how to manage life goals overall.    Pertinent History anxiety disorder, MDD    Currently in Pain? No/denies    Pain Score 0-No pain    Multiple Pain Sites No             OT Assessment  Diagnosis: Anxiety disorder / MDD Past medical history/referral information: NA [facility]/ [facility]  ADLs: independent Work: unemployed  Leisure: reading / TV Social support: positive / mother / family Struggles: Pharmacologistcoping skills / time Teacher, English as a foreign languagemgmt / goal setting / general psychosocial skills OT goal: improve general psychosocial skills and coping / time mgmt   OCAIRS Mental Health Interview Summary of Client Scores:  Facilitates participation in occupation Allows participation in occupation Inhibits participation in occupation Restricts participation in occupation Comments:  Roles   X    Habits   X    Personal Causation   X    Values  X     Interests   X    Skills  X     Short-Term Goals   X  Long-term Goals   X    Interpretation of Past Experiences   X    Physical Environment  X     Social Environment   X    Readiness for Change   X      Need for Occupational Therapy:          2 Need for OT intervention indicated to restore/improve participation      Assessment:  Patient demonstrates behavior that INHIBITS participation in occupation.  Patient will benefit from occupational therapy intervention in order to improve time management, financial management, stress management, job readiness skills, social skills, and health management skills in preparation to return to full time community living and to be a productive community member.    Plan:  Patient will participate in skilled occupational therapy sessions individually  or in a group setting to improve coping skills, psychosocial skills, and emotional skills required to return to prior level of function. Treatment will be 4-5 times per week for 4 weeks.      Group Session:  S: I hope to learn new skills to help me improve my sleep through this group  O: During the group therapy session, the occupational therapist discussed the impact of sleep disturbances on daily activities and overall health and wellbeing.   The OT also reviewed various types of sleep disorders, including insomnia, sleep apnea, restless leg syndrome, and narcolepsy, and their associated symptoms. Strategies for managing and treating sleep disturbances were also discussed, such as establishing a consistent sleep routine, avoiding stimulants before bedtime, and engaging in relaxation techniques.   Today's group also included information on how sleep disturbances can cause fatigue, mood changes, cognitive impairment, and physical health problems, and emphasizes the importance of seeking prompt treatment to maintain overall health and wellbeing.  A: Based on today's performance, pt would continue to benefit from cont'd PHP OT group therapy intervention in order to address the deficits that impact and inhibit independence in community re-entry and psychosocial dynamics / skills.   P: Continue to attend PHP OT group sessions 5x week for 4 weeks to promote daily structure, social engagement, and opportunities to develop and utilize adaptive strategies to maximize functional performance in preparation for safe transition and integration back into school, work, and the community. Plan to address topic of tbd in next OT group session.                     OT Education - 08/21/21 1839     Education Details OT Role / POC and Sleep Hygiene during Tx session    Person(s) Educated Patient    Methods Explanation;Demonstration;Handout    Comprehension Verbalized understanding               OT Short Term Goals - 08/21/21 1850       OT SHORT TERM GOAL #1   Title Pt will demonstrate independence in psychosocial skill areas in order to successfully engage in community reentry upon DC    Time 4    Period Weeks    Status New    Target Date 09/20/21      OT SHORT TERM GOAL #2   Title Pt will demonstrate improved coping skills in order to manage iADLs and [place] at time of discharge      OT SHORT TERM GOAL #3   Title pt to demonstrate independence w/ time mgmt and goal setting for iADLs at time of discharge from this [facility] program.  Plan - 08/21/21 1846     Clinical Impression Statement Pt appears fairly anxious however rager to participate w/ OT to address deficits in psychosocial skills that inhibit her ability to engage in daily self care and iADLs    OT Occupational Profile and History Problem Focused Assessment - Including review of records relating to presenting problem    Occupational performance deficits (Please refer to evaluation for details): IADL's;Rest and Sleep;Education;Work;Social Participation    Psychosocial Skills Coping Strategies;Habits;Interpersonal Interaction;Routines and Behaviors    Rehab Potential Good    Clinical Decision Making Limited treatment options, no task modification necessary    Comorbidities Affecting Occupational Performance: None    Modification or Assistance to Complete Evaluation  No modification of tasks or assist necessary to complete eval    OT Frequency 5x / week    OT Duration 4 weeks    OT Treatment/Interventions Coping strategies training;Psychosocial skills training;Patient/family education    Consulted and Agree with Plan of Care Patient             Patient will benefit from skilled therapeutic intervention in order to improve the following deficits and impairments:       Psychosocial Skills: Coping Strategies, Habits, Interpersonal Interaction, Routines and Behaviors   Visit  Diagnosis: Difficulty coping  MDD (major depressive disorder), severe (HCC)    Problem List Patient Active Problem List   Diagnosis Date Noted   Anxiety disorder, unspecified 08/06/2021   Depression 08/06/2021   MDD (major depressive disorder), severe (HCC) 08/06/2021   Constipation 01/05/2021   TOA (tubo-ovarian abscess) 01/05/2021   Sepsis (HCC) 01/01/2021   Endometriosis 05/06/2020   Hypothyroid 04/22/2020   Hypothyroidism, acquired, 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   Acanthosis 12/16/2014   Pediatric overweight 12/16/2014    Ted Mcalpine, OT 08/21/2021, 6:54 PM  Kerrin Champagne, OT   [facility] HOSPITALIZATION PROGRAM [address] [place], [place], [address] Phone: [phone]   Fax:  [phone]  Name: Rebecca Vaughan MRN: [id number] Date of Birth: 2000-09-13

## 2021-08-22 ENCOUNTER — Other Ambulatory Visit (HOSPITAL_COMMUNITY): Payer: 59

## 2021-08-22 ENCOUNTER — Encounter (HOSPITAL_COMMUNITY): Payer: Self-pay

## 2021-08-22 ENCOUNTER — Other Ambulatory Visit (HOSPITAL_COMMUNITY): Payer: 59 | Admitting: Licensed Clinical Social Worker

## 2021-08-22 DIAGNOSIS — R4589 Other symptoms and signs involving emotional state: Secondary | ICD-10-CM

## 2021-08-22 DIAGNOSIS — F322 Major depressive disorder, single episode, severe without psychotic features: Secondary | ICD-10-CM

## 2021-08-22 NOTE — Progress Notes (Signed)
Spoke with patient via Webex video call, used 2 identifiers to correctly identify patient. States that she was inpatient at [facility] after on overdose on Trazodone.  That was her first suicide attempt and she stayed 6 days inpatient. Has some legal issues, lost her job, family and relationship issues she is dealing with. She became overwhelmed and took an OD. Denies SI currently as well as denies HI or AV hallucinations. On scale 1-10 as 10 being worst she rates depression at 4 and anxiety at 5. PHQ9=12. No side effects from medications. No issues or complaints.

## 2021-08-22 NOTE — Therapy (Signed)
[place], [place], [address] Phone: [phone]   Fax:  [phone]  Occupational Therapy Treatment  Virtual Visit via Video Note  I connected with [name] on 08/22/21 at  8:00 AM EDT by a video enabled telemedicine application and verified that I am speaking with the correct person using two identifiers.  Location: Patient: home Provider: office   I discussed the limitations of evaluation and management by telemedicine and the availability of in person appointments. The patient expressed understanding and agreed to proceed.    The patient was advised to call back or seek an in-person evaluation if the symptoms worsen or if the condition fails to improve as anticipated.  I provided 55 minutes of non-face-to-face time during this encounter.   Patient Details  Name: Rebecca Vaughan MRN: [id number] Date of Birth: [birth year] No data recorded  Encounter Date: 08/22/2021   OT End of Session - 08/22/21 1326     Visit Number 2    Number of Visits 20    Date for OT Re-Evaluation 09/20/21    Authorization Type AETNA AETNA NAP  [id number] [place]    OT Start Time 1200    OT Stop Time 1255    OT Time Calculation (min) 55 min             Past Medical History:  Diagnosis Date   Anxiety    Constipation    COVID 10/2019   fever chills headache x 5 days all symptoms resolved   Depression    Endometriosis    Hypothyroidism    Pneumonia as child age 20   right lung   Thyroid disease    hyperthyroid   Wears glasses     Past Surgical History:  Procedure Laterality Date   LAPAROTOMY N/A 05/06/2020   Procedure: EXPLORATORY LAPAROTOMY, LYSIS OF ADHESIONS, DRAINAGE OF ENDOMETRIOMAS, PARTIAL RIGHT OVARIAN CYSTECTOMY;  Surgeon: Dian Queen, MD;  Location: [place];  Service: Gynecology;  Laterality: N/A;    There were no vitals filed  for this visit.   Subjective Assessment - 08/22/21 1325     Currently in Pain? No/denies    Pain Score 0-No pain              Group Session:  S: "feeling better today"  O: During the group therapy session on Stepping Out of Your Comfort Zone, patients actively engaged in discussions and activities focused on understanding the concept of the comfort zone and its impact on personal growth. The session aimed to provide patients with practical strategies and techniques for expanding their comfort zones, fostering a supportive and collaborative environment.  Participants openly shared their experiences with depression and anxiety, expressing their desire for personal growth and a willingness to overcome the limitations imposed by their comfort zones. Interactive activities and group discussions allowed patients to explore misconceptions surrounding the comfort zone and the potential for growth beyond it. Inspirational quotes from historical figures were shared to inspire and motivate patients in their journey of stepping outside their comfort zones.  The session facilitated a safe space for patients to voice their thoughts, concerns, and aspirations related to stepping beyond their comfort zones. Therapeutic interventions, such as gradual exposure, setting achievable goals, seeking support, and embracing challenges, were explored. The group fostered open communication, empathy, and collaboration among patients, creating an environment conducive to personal growth and self-discovery.  Moving forward, future group sessions  will continue to delve into practical strategies and techniques, incorporating activities like role-playing, creative expression, and mindfulness exercises. Patients will be encouraged to set individual goals, track their progress, and share their achievements and challenges in subsequent sessions. Additional resources, such as articles, books, or worksheets, will be provided to  further support patients' understanding and application of stepping outside their comfort zones.  A: During the virtual group therapy session on Stepping Out of Your Comfort Zone 1/2, the passively engaged patient exhibited minimal participation and contribution to the discussions. They appeared less willing to share personal experiences and express thoughts or concerns related to the topic. The patient's level of engagement seemed passive, as they listened more than actively participated. Their limited interaction in the session indicated a potential reluctance or discomfort in stepping outside their comfort zone. Additional support and encouragement may be necessary to facilitate their active engagement in future sessions and to foster a stronger sense of involvement within the group dynamic.  P: Continue to attend PHP OT group sessions 5x week for 4 weeks to promote daily structure, social engagement, and opportunities to develop and utilize adaptive strategies to maximize functional performance in preparation for safe transition and integration back into school, work, and the community. Plan to address topic of Embracing Growth: Stepping Beyond Your Comfort Zone 2/2 in next OT group session.                     OT Education - 08/22/21 1326     Education Details Embracing Growth: Stepping Beyond Your Comfort Zone 1/2              OT Short Term Goals - 08/21/21 1850       OT SHORT TERM GOAL #1   Title Pt will demonstrate independence in psychosocial skill areas in order to successfully engage in community reentry upon DC    Time 4    Period Weeks    Status New    Target Date 09/20/21      OT SHORT TERM GOAL #2   Title Pt will demonstrate improved coping skills in order to manage iADLs and [place] at time of discharge      OT SHORT TERM GOAL #3   Title pt to demonstrate independence w/ time mgmt and goal setting for iADLs at time of discharge from this [facility] program.                       Plan - 08/22/21 1327     Psychosocial Skills Coping Strategies;Habits;Interpersonal Interaction;Routines and Behaviors             Patient will benefit from skilled therapeutic intervention in order to improve the following deficits and impairments:       Psychosocial Skills: Coping Strategies, Habits, Interpersonal Interaction, Routines and Behaviors   Visit Diagnosis: Difficulty coping    Problem List Patient Active Problem List   Diagnosis Date Noted   Anxiety disorder, unspecified 08/06/2021   Depression 08/06/2021   MDD (major depressive disorder), severe (HCC) 08/06/2021   Constipation 01/05/2021   TOA (tubo-ovarian abscess) 01/05/2021   Sepsis (HCC) 01/01/2021   Endometriosis 05/06/2020   Hypothyroid 04/22/2020   Hypothyroidism, acquired, 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   Acanthosis 12/16/2014   Pediatric overweight 12/16/2014    Ted Mcalpine, OT 08/22/2021, 1:27 PM  Kerrin Champagne, OT   [facility] HOSPITALIZATION PROGRAM [address] [place], [place], [address] Phone: [phone]  Fax:  [phone]  Name: Rebecca Vaughan MRN: [id number] Date of Birth: [birth year]

## 2021-08-23 ENCOUNTER — Other Ambulatory Visit (HOSPITAL_COMMUNITY): Payer: 59 | Admitting: Licensed Clinical Social Worker

## 2021-08-23 ENCOUNTER — Other Ambulatory Visit (HOSPITAL_COMMUNITY): Payer: 59

## 2021-08-23 DIAGNOSIS — F322 Major depressive disorder, single episode, severe without psychotic features: Secondary | ICD-10-CM

## 2021-08-24 ENCOUNTER — Other Ambulatory Visit (HOSPITAL_COMMUNITY): Payer: 59

## 2021-08-24 ENCOUNTER — Other Ambulatory Visit (HOSPITAL_COMMUNITY): Payer: 59 | Admitting: Licensed Clinical Social Worker

## 2021-08-24 ENCOUNTER — Encounter (HOSPITAL_COMMUNITY): Payer: Self-pay

## 2021-08-24 DIAGNOSIS — F322 Major depressive disorder, single episode, severe without psychotic features: Secondary | ICD-10-CM

## 2021-08-24 DIAGNOSIS — R4589 Other symptoms and signs involving emotional state: Secondary | ICD-10-CM

## 2021-08-24 NOTE — Therapy (Signed)
[facility] PARTIAL HOSPITALIZATION PROGRAM [address] [place], [place], [address] Phone: [phone]   Fax:  [phone]  Occupational Therapy Treatment Virtual Visit via Video Note  I connected with Sunday Corn on 08/24/21 at  8:00 AM EDT by a video enabled telemedicine application and verified that I am speaking with the correct person using two identifiers.  Location: Patient: home Provider: office   I discussed the limitations of evaluation and management by telemedicine and the availability of in person appointments. The patient expressed understanding and agreed to proceed.    The patient was advised to call back or seek an in-person evaluation if the symptoms worsen or if the condition fails to improve as anticipated.  I provided 55 minutes of non-face-to-face time during this encounter.   Patient Details  Name: Rebecca Vaughan MRN: [id number] Date of Birth: 07/20/2000 No data recorded  Encounter Date: 08/24/2021   OT End of Session - 08/24/21 1346     Visit Number 3    Number of Visits 20    Date for OT Re-Evaluation 09/20/21    Authorization Type AETNA AETNA NAP  [id number] [place]    OT Start Time 1200    OT Stop Time 1255    OT Time Calculation (min) 55 min             Past Medical History:  Diagnosis Date   Anxiety    Constipation    COVID 10/2019   fever chills headache x 5 days all symptoms resolved   Depression    Endometriosis    Hypothyroidism    Pneumonia as child age 20   right lung   Thyroid disease    hyperthyroid   Wears glasses     Past Surgical History:  Procedure Laterality Date   LAPAROTOMY N/A 05/06/2020   Procedure: EXPLORATORY LAPAROTOMY, LYSIS OF ADHESIONS, DRAINAGE OF ENDOMETRIOMAS, PARTIAL RIGHT OVARIAN CYSTECTOMY;  Surgeon: Marcelle Overlie, MD;  Location: [facility];  Service: Gynecology;  Laterality: N/A;    There were no vitals filed  for this visit.   Subjective Assessment - 08/24/21 1345     Currently in Pain? No/denies    Pain Score 0-No pain               Group Session:  S: "Feeling better today"  O: During the group therapy session on Stepping Out of Your Comfort Zone, patients actively engaged in discussions and activities focused on understanding the concept of the comfort zone and its impact on personal growth. The session aimed to provide patients with practical strategies and techniques for expanding their comfort zones, fostering a supportive and collaborative environment.  Participants openly shared their experiences with depression and anxiety, expressing their desire for personal growth and a willingness to overcome the limitations imposed by their comfort zones. Interactive activities and group discussions allowed patients to explore misconceptions surrounding the comfort zone and the potential for growth beyond it. Inspirational quotes from historical figures were shared to inspire and motivate patients in their journey of stepping outside their comfort zones.  The session facilitated a safe space for patients to voice their thoughts, concerns, and aspirations related to stepping beyond their comfort zones. Therapeutic interventions, such as gradual exposure, setting achievable goals, seeking support, and embracing challenges, were explored. The group fostered open communication, empathy, and collaboration among patients, creating an environment conducive to personal growth and self-discovery.  Moving forward, future group sessions  will continue to delve into practical strategies and techniques, incorporating activities like role-playing, creative expression, and mindfulness exercises. Patients will be encouraged to set individual goals, track their progress, and share their achievements and challenges in subsequent sessions. Additional resources, such as articles, books, or worksheets, will be provided to  further support patients' understanding and application of stepping outside their comfort zones.  A: During the virtual group therapy session on Stepping Out of Your Comfort Zone, the engaged patient exhibited minimal participation and contribution to the discussions. They appeared less willing to share personal experiences and express thoughts or concerns related to the topic. The patient's level of engagement seemed passive, as they listened more than actively participated. Their limited interaction in the session indicated a potential reluctance or discomfort in stepping outside their comfort zone. Additional support and encouragement may be necessary to facilitate their active engagement in future sessions and to foster a stronger sense of involvement within the group dynamic.  P: Continue to attend PHP OT group sessions 5x week for 4 weeks to promote daily structure, social engagement, and opportunities to develop and utilize adaptive strategies to maximize functional performance in preparation for safe transition and integration back into school, work, and the community. Plan to address topic of tbd in next OT group session.                    OT Education - 08/24/21 1345     Education Details Embracing Growth: Stepping Beyond Your Comfort Zone 2/2              OT Short Term Goals - 08/21/21 1850       OT SHORT TERM GOAL #1   Title Pt will demonstrate independence in psychosocial skill areas in order to successfully engage in community reentry upon DC    Time 4    Period Weeks    Status New    Target Date 09/20/21      OT SHORT TERM GOAL #2   Title Pt will demonstrate improved coping skills in order to manage iADLs and [place] at time of discharge      OT SHORT TERM GOAL #3   Title pt to demonstrate independence w/ time mgmt and goal setting for iADLs at [facility].                      Plan - 08/24/21 1346      Psychosocial Skills Coping Strategies;Habits;Interpersonal Interaction;Routines and Behaviors             Patient will benefit from skilled therapeutic intervention in order to improve the following deficits and impairments:       Psychosocial Skills: Coping Strategies, Habits, Interpersonal Interaction, Routines and Behaviors   Visit Diagnosis: Difficulty coping    Problem List Patient Active Problem List   Diagnosis Date Noted   Anxiety disorder, unspecified 08/06/2021   Depression 08/06/2021   MDD (major depressive disorder), severe (HCC) 08/06/2021   Constipation 01/05/2021   TOA (tubo-ovarian abscess) 01/05/2021   Sepsis (HCC) 01/01/2021   Endometriosis 05/06/2020   Hypothyroid 04/22/2020   Hypothyroidism, acquired, 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   Acanthosis 12/16/2014   Pediatric overweight 12/16/2014    Ted McalpineEdward G Christabel Camire, OT 08/24/2021, 1:46 PM  Kerrin ChampagneEdward Benoit Meech, OT   [facility] [address] [place], [place], [address] Phone: [phone]   Fax:  [phone]  Name: Sunday CornDanielle K [place]ola MRN: [id number]  Date of Birth: 2000-06-10

## 2021-08-25 ENCOUNTER — Other Ambulatory Visit (HOSPITAL_COMMUNITY): Payer: 59

## 2021-08-25 ENCOUNTER — Encounter (HOSPITAL_COMMUNITY): Payer: Self-pay | Admitting: Family

## 2021-08-25 ENCOUNTER — Other Ambulatory Visit (HOSPITAL_COMMUNITY): Payer: 59 | Admitting: Licensed Clinical Social Worker

## 2021-08-25 ENCOUNTER — Encounter (HOSPITAL_COMMUNITY): Payer: Self-pay

## 2021-08-25 DIAGNOSIS — F322 Major depressive disorder, single episode, severe without psychotic features: Secondary | ICD-10-CM

## 2021-08-25 DIAGNOSIS — R4589 Other symptoms and signs involving emotional state: Secondary | ICD-10-CM

## 2021-08-25 NOTE — Progress Notes (Deleted)
Virtual Visit via Video Note  I connected with Rebecca Vaughan on 08/25/21 at  9:00 AM EDT by a video enabled telemedicine application and verified that I am speaking with the correct person using two identifiers.  Location: Patient: Home Provider: Office   I discussed the limitations of evaluation and management by telemedicine and the availability of in person appointments. The patient expressed understanding and agreed to proceed.  I discussed the assessment and treatment plan with the patient. The patient was provided an opportunity to ask questions and all were answered. The patient agreed with the plan and demonstrated an understanding of the instructions.   The patient was advised to call back or seek an in-person evaluation if the symptoms worsen or if the condition fails to improve as anticipated.  I provided 15 minutes of non-face-to-face time during this encounter.   Rebecca Rack, NP    Behavioral Health Partial Program Assessment Note  Date: 08/25/2021 Name: Rebecca Vaughan MRN: [id number]    NID:[id number] [name] is a 21 y.o. African American female presents after a recent inpatient admission due to a suicide attempted to overdose.  She reports she is currently followed by Merlinda Frederick at [facility] psychiatric where she is currently prescribed Effexor, hydroxyzine and Topamax for mood stabilization.  States she resides with her mother and has plans to attend school to study cardiology at [employer].  Denied previous inpatient admissions.  Does admit to using marijuana occasionally.  Patient was enrolled in partial psychiatric program on 08/21/21.  Per discharge assessment note: "Rebecca Vaughan is a 21 YO F with a history of depression and anxiety who presents s/p intentional overdose on trazodone following a series of financial, family, employment and legal stressors.On interview, the patient explains a 2 year decline. 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 When her parent got into financial trouble, she stole from work to help out. She ended up with some legal trouble that is now coming to pass. She has been having increased anxiety and panic attacks. She felt responsible for this because she had medical issues from chronic pain that resulted in surgery bills contributing. She is having a hard time dealing with the guilt and shame from all of it. She is not sleeping or eating very well. She feels tense all of the time. She was started on lexapro and trazodone for sleep. She has been started with therapy already. Her energy is low. No hallucinations, paranoia or delusions. She rarely drinks alcohol and has used cannabis for her chronic pain."  Primary complaints include: anxiety, depression worse, and feeling depressed.  Onset of symptoms was gradual with gradually worsening course since that time. Psychosocial Stressors include the following: family and occupational.   I have reviewed the following documentation dated 08/25/2021: past psychiatric history and past medical history  Complaints of Pain: nonear Past Psychiatric History:  None  Currently in treatment with Effexor, trazodone and Topamax  Substance Abuse History: marijuana Use of Alcohol: denied Use of Caffeine: denies use Use of over the counter:   Past Surgical History:  Procedure Laterality Date   LAPAROTOMY N/A 05/06/2020   Procedure: EXPLORATORY LAPAROTOMY, LYSIS OF ADHESIONS, DRAINAGE OF ENDOMETRIOMAS, PARTIAL RIGHT OVARIAN CYSTECTOMY;  Surgeon: Marcelle Overlie, MD;  Location: [facility];  Service: Gynecology;  Laterality: N/A;    Past Medical History:  Diagnosis Date   Anxiety    Constipation    COVID 10/2019   fever chills  headache x 5 days all symptoms resolved   Depression    Endometriosis    Hypothyroidism    Pneumonia as child age 81   right lung   Thyroid disease    hyperthyroid   Wears glasses    Outpatient Encounter  Medications as of 08/25/2021  Medication Sig   Elagolix Sodium (ORILISSA) 200 MG TABS Take 200 mg by mouth in the morning and at bedtime.   hydrOXYzine (ATARAX) 25 MG tablet Take 1 tablet (25 mg total) by mouth every 8 (eight) hours as needed for anxiety.   LARIN FE 1/20 1-20 MG-MCG tablet Take 1 tablet by mouth daily.   levothyroxine (SYNTHROID) 100 MCG tablet Take 100 mcg by mouth daily.   melatonin 5 MG TABS Take 1 tablet (5 mg total) by mouth at bedtime as needed (insomnia).   nicotine (NICODERM CQ - DOSED IN MG/24 HOURS) 21 mg/24hr patch Place 1 patch (21 mg total) onto the skin daily as needed (smoking cessation). (Patient not taking: Reported on 08/22/2021)   topiramate (TOPAMAX) 25 MG tablet Take 1 tablet (25 mg total) by mouth 2 (two) times daily.   venlafaxine XR (EFFEXOR-XR) 75 MG 24 hr capsule Take 1 capsule (75 mg total) by mouth daily with breakfast.   Vitamin D, Ergocalciferol, (DRISDOL) 1.25 MG (50000 UNIT) CAPS capsule Take 1 capsule (50,000 Units total) by mouth every 7 (seven) days.   No facility-administered encounter medications on file as of 08/25/2021.   Allergies  Allergen Reactions   Kiwi Extract Other (See Comments)    Lips get black scabs    Social History   Tobacco Use   Smoking status: Never   Smokeless tobacco: Never   Tobacco comments:    No smokers at home  Substance Use Topics   Alcohol use: Not Currently   Functioning Relationships: good support system Education: College       Please specify degree:  Other Pertinent History: Financial and Legal Family History  Problem Relation Age of Onset   Anxiety disorder Mother    Other Mother    Thyroid disease Mother    Hypertension Father    Diabetes Father    Hypercholesterolemia Father    Alcohol abuse Maternal Aunt    Drug abuse Maternal Aunt    Drug abuse Maternal Uncle    Alcohol abuse Maternal Uncle    Drug abuse Maternal Grandfather    Alcohol abuse Maternal Grandfather    Hypertension Maternal  Grandmother    Drug abuse Paternal Grandfather    Alcohol abuse Paternal Grandfather    Hypertension Paternal Grandfather    Diabetes Paternal Grandfather    Hypertension Paternal Grandmother    Diabetes Paternal Grandmother      Review of Systems Constitutional: negative  Objective:  There were no vitals filed for this visit.  Physical Exam:   Mental Status Exam: Appearance:  Well groomed Psychomotor::  Within Normal Limits Attention span and concentration: Normal Behavior: calm, cooperative, and adequate rapport can be established Speech:  normal volume Mood:  depressed and anxious Affect:  normal Thought Process:  Coherent Thought Content:  Logical Orientation:  person, place, and time/date Cognition:  grossly intact Insight:  Intact Judgment:  Intact Estimate of Intelligence: Average Fund of knowledge: Aware of current events Memory: Recent and remote intact Abnormal movements: None Gait and station: Normal  Assessment:  Diagnosis: MDD (major depressive disorder), severe (HCC) [F32.2] 1. MDD (major depressive disorder), severe (HCC)     Indications for admission: inpatient care  required if not in partial hospital program  Plan: Orders placed for occupational therapy (OT) patient enrolled in Partial Hospitalization Program, patient's current medications are to be continued, a comprehensive treatment plan will be developed, and side effects of medications have been reviewed with patient  Collaboration of Care: Medication Management AEB continue Effexor, trazodone and Topamax for mood stabilization  Patient/Guardian was advised Release of Information must be obtained prior to any record release in order to collaborate their care with an outside provider. Patient/Guardian was advised if they have not already done so to contact the registration department to sign all necessary forms in order for [place] to release information regarding their care.   Consent:  Patient/Guardian gives verbal consent for treatment and assignment of benefits for services provided during this visit. Patient/Guardian expressed understanding and agreed to proceed.   Treatment options and alternatives reviewed with patient and patient understands the above plan.  Treatment plan was reviewed and agreed upon by NP Chilton Greathouse and patient Rebecca Vaughan need for group services.    Rebecca Rack, NP

## 2021-08-25 NOTE — Therapy (Signed)
[facility] PARTIAL HOSPITALIZATION PROGRAM [address] [place], [place], [address] Phone: [phone]   Fax:  [phone]  Occupational Therapy Treatment Virtual Visit via Video Note  I connected with Rebecca Vaughan on 08/25/21 at  8:00 AM EDT by a video enabled telemedicine application and verified that I am speaking with the correct person using two identifiers.  Location: Patient: home Provider: office   I discussed the limitations of evaluation and management by telemedicine and the availability of in person appointments. The patient expressed understanding and agreed to proceed.    The patient was advised to call back or seek an in-person evaluation if the symptoms worsen or if the condition fails to improve as anticipated.  I provided 55 minutes of non-face-to-face time during this encounter.   Patient Details  Name: Rebecca Vaughan MRN: [id number] Date of Birth: 2000-08-24 No data recorded  Encounter Date: 08/25/2021   OT End of Session - 08/25/21 1355     Visit Number 4    Number of Visits 20    Date for OT Re-Evaluation 09/20/21    Authorization Type AETNA AETNA NAP  [id number] [place]    OT Start Time 1200    OT Stop Time 1255    OT Time Calculation (min) 55 min             Past Medical History:  Diagnosis Date   Anxiety    Constipation    COVID 10/2019   fever chills headache x 5 days all symptoms resolved   Depression    Endometriosis    Hypothyroidism    Pneumonia as child age 83   right lung   Thyroid disease    hyperthyroid   Wears glasses     Past Surgical History:  Procedure Laterality Date   LAPAROTOMY N/A 05/06/2020   Procedure: EXPLORATORY LAPAROTOMY, LYSIS OF ADHESIONS, DRAINAGE OF ENDOMETRIOMAS, PARTIAL RIGHT OVARIAN CYSTECTOMY;  Surgeon: Marcelle Overlie, MD;  Location: [facility] [place];  Service: Gynecology;  Laterality: N/A;    There were no vitals filed  for this visit.   Subjective Assessment - 08/25/21 1355     Currently in Pain? No/denies    Pain Score 0-No pain              Group Session:  S: "I'm here" / "doing good I guess"  O: The objective of the group therapy session was to explore the concept of working memory and its relationship with learning, specifically tailored to individuals experiencing mental health challenges. The session aimed to provide psychoeducation on the importance of intact working memory for attention, comprehension, and retention of information. Participants discussed common short-term memory problems they face in their lives, including forgetting names, misplacing items, and struggling with memorization. They recognized the impact of depression, anxiety, and other mental health conditions on their working memory functioning.  The group facilitator guided the participants in brainstorming creative strategies to overcome these memory challenges within their individual capabilities. The group emphasized self-help approaches that could be implemented independently, taking into consideration the participants' mental health limitations. The session concluded with a focus on the importance of self-care, seeking professional help when needed, and the potential benefits of incorporating cognitive training exercises to enhance working memory.  Overall, the objective of the group therapy session was to provide a supportive and informative environment for participants to explore the topic of working memory, understand its relevance to their learning experiences, and equip  them with practical strategies to mitigate memory-related difficulties in their daily lives.  A: During the telehealth session, the patient exhibited limited participation and interaction regarding the topic of working memory and learning. The patient seemed disengaged and demonstrated minimal interest in sharing their experiences or challenges related to  memory difficulties. Their responses were brief and lacked depth, suggesting a lower level of investment in the session.  The patient displayed limited curiosity and did not actively seek information or guidance during the session. Their passive involvement may indicate a lack of motivation or a lower level of interest in understanding the relationship between working memory and their mental health conditions. It is essential to further assess their level of commitment and explore potential barriers to engagement to provide tailored support and encourage active participation in future sessions.  P: Continue to attend PHP OT group sessions 5x week for 4 weeks to promote daily structure, social engagement, and opportunities to develop and utilize adaptive strategies to maximize functional performance in preparation for safe transition and integration back into school, work, and the community. Plan to address topic of The Significance of Working Memory and its Impact on Adult Learning 2/2 in next OT group session.                     OT Education - 08/25/21 1355     Education Details The Significance of Working Memory and its Impact on Adult Learning              OT Short Term Goals - 08/21/21 1850       OT SHORT TERM GOAL #1   Title Pt will demonstrate independence in psychosocial skill areas in order to successfully engage in community reentry upon DC    Time 4    Period Weeks    Status New    Target Date 09/20/21      OT SHORT TERM GOAL #2   Title Pt will demonstrate improved coping skills in order to manage iADLs and [place] at time of discharge      OT SHORT TERM GOAL #3   Title pt to demonstrate independence w/ time mgmt and goal setting for iADLs at time of discharge from this [facility] program.                      Plan - 08/25/21 1355     Psychosocial Skills Coping Strategies;Habits;Interpersonal Interaction;Routines and Behaviors              Patient will benefit from skilled therapeutic intervention in order to improve the following deficits and impairments:       Psychosocial Skills: Coping Strategies, Habits, Interpersonal Interaction, Routines and Behaviors   Visit Diagnosis: Difficulty coping    Problem List Patient Active Problem List   Diagnosis Date Noted   Anxiety disorder, unspecified 08/06/2021   Depression 08/06/2021   MDD (major depressive disorder), severe (HCC) 08/06/2021   Constipation 01/05/2021   TOA (tubo-ovarian abscess) 01/05/2021   Sepsis (HCC) 01/01/2021   Endometriosis 05/06/2020   Hypothyroid 04/22/2020   Hypothyroidism, acquired, 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 [place], [place], [address] Phone: [phone]   Fax:  [phone]  Name: Rebecca Vaughan MRN: [id number] Date of Birth: 05/24/2000

## 2021-08-25 NOTE — Psych (Deleted)
Behavioral Health Partial Program Assessment Note  Date: 08/25/2021 Name: Rebecca Vaughan MRN: [id number]  Chief Complaint: ***  Subjective:   HPI: Patient is a 21 y.o. {race/ethnicity:22866} female presents with ***.  Patient was enrolled in partial psychiatric program on 08/25/21.  Primary complaints include: { :22888}.  Onset of symptoms was {onset:22896} with {clinical course - history:22865} course since that time. Psychosocial Stressors include the following: { :22893}.   I have reviewed the following documentation dated ***: {doc:22884}  Complaints of Pain: {pain assessment:22895}ar Past Psychiatric History:  { :22886}  Currently in treatment with ***.  Substance Abuse History: {Recreational drugs:22897} Use of Alcohol: { :22889} Use of Caffeine: { :22892} Use of over the counter: ***  Past Surgical History:  Procedure Laterality Date   LAPAROTOMY N/A 05/06/2020   Procedure: EXPLORATORY LAPAROTOMY, LYSIS OF ADHESIONS, DRAINAGE OF ENDOMETRIOMAS, PARTIAL RIGHT OVARIAN CYSTECTOMY;  Surgeon: Marcelle Overlie, MD;  Location: [facility];  Service: Gynecology;  Laterality: N/A;    Past Medical History:  Diagnosis Date   Anxiety    Constipation    COVID 10/2019   fever chills headache x 5 days all symptoms resolved   Depression    Endometriosis    Hypothyroidism    Pneumonia as child age 32   right lung   Thyroid disease    hyperthyroid   Wears glasses    Outpatient Encounter Medications as of 08/25/2021  Medication Sig   Elagolix Sodium (ORILISSA) 200 MG TABS Take 200 mg by mouth in the morning and at bedtime.   hydrOXYzine (ATARAX) 25 MG tablet Take 1 tablet (25 mg total) by mouth every 8 (eight) hours as needed for anxiety.   LARIN FE 1/20 1-20 MG-MCG tablet Take 1 tablet by mouth daily.   levothyroxine (SYNTHROID) 100 MCG tablet Take 100 mcg by mouth daily.   melatonin 5 MG TABS Take 1 tablet (5 mg total) by mouth at bedtime as needed (insomnia).    nicotine (NICODERM CQ - DOSED IN MG/24 HOURS) 21 mg/24hr patch Place 1 patch (21 mg total) onto the skin daily as needed (smoking cessation). (Patient not taking: Reported on 08/22/2021)   topiramate (TOPAMAX) 25 MG tablet Take 1 tablet (25 mg total) by mouth 2 (two) times daily.   venlafaxine XR (EFFEXOR-XR) 75 MG 24 hr capsule Take 1 capsule (75 mg total) by mouth daily with breakfast.   Vitamin D, Ergocalciferol, (DRISDOL) 1.25 MG (50000 UNIT) CAPS capsule Take 1 capsule (50,000 Units total) by mouth every 7 (seven) days.   No facility-administered encounter medications on file as of 08/25/2021.   Allergies  Allergen Reactions   Kiwi Extract Other (See Comments)    Lips get black scabs    Social History   Tobacco Use   Smoking status: Never   Smokeless tobacco: Never   Tobacco comments:    No smokers at home  Substance Use Topics   Alcohol use: Not Currently   Functioning Relationships: { :22891} Education: {Education:22867} Other Pertinent History: {historical info:22887} Family History  Problem Relation Age of Onset   Anxiety disorder Mother    Other Mother    Thyroid disease Mother    Hypertension Father    Diabetes Father    Hypercholesterolemia Father    Alcohol abuse Maternal Aunt    Drug abuse Maternal Aunt    Drug abuse Maternal Uncle    Alcohol abuse Maternal Uncle    Drug abuse Maternal Grandfather    Alcohol abuse Maternal Grandfather  Hypertension Maternal Grandmother    Drug abuse Paternal Grandfather    Alcohol abuse Paternal Grandfather    Hypertension Paternal Grandfather    Diabetes Paternal Grandfather    Hypertension Paternal Grandmother    Diabetes Paternal Grandmother      Review of Systems {Review Of Systems:22885}  Objective:  There were no vitals filed for this visit.  Physical Exam: {Exam, Complete:22871}  Mental Status Exam: Appearance:  {appearance:22767} Psychomotor::  {Exam; behavior child:22792} Attention span and  concentration: {Att/con:22794} Behavior: {behavior:22795} Speech:  {Speech:22796} Mood:  {mood:22843} Affect:  {Desc; affect:22845::"normal"} Thought Process:  {Thought Process (PAA):22688} Thought Content:  {Thought Content:22690} Orientation:  {orientation:22848} Cognition:  {Psych cognition:22849} Insight:  {insight and judgement:22851} Judgment:  {insight and judgement:22851} Estimate of Intelligence: {Choices:22852} Fund of knowledge: {Knowledge:22856} Memory: {Memory:22857} Abnormal movements: {Abnormal:22859} Gait and station: {Gait and Station:22860}  Assessment:  Diagnosis: MDD (major depressive disorder), severe (HCC) [F32.2] 1. MDD (major depressive disorder), severe (HCC)     Indications for admission: {Indications:22876}  Plan: {NLGX:21194}  Collaboration of Care: {BH OP Collaboration of Care:21014065}  Patient/Guardian was advised Release of Information must be obtained prior to any record release in order to collaborate their care with an outside provider. Patient/Guardian was advised if they have not already done so to contact the registration department to sign all necessary forms in order for [place] to release information regarding their care.   Consent: Patient/Guardian gives verbal consent for treatment and assignment of benefits for services provided during this visit. Patient/Guardian expressed understanding and agreed to proceed.   Treatment options and alternatives reviewed with patient and patient {Understands:22882}.  Comments: *** .    Oneta Rack, NP

## 2021-08-25 NOTE — Progress Notes (Signed)
Virtual Visit via Video Note   I connected with Rebecca Cornanielle K Ky on 08/25/21 at  9:00 AM EDT by a video enabled telemedicine application and verified that I am speaking with the correct person using two identifiers.   Location: Patient: Home Provider: Office   I discussed the limitations of evaluation and management by telemedicine and the availability of in person appointments. The patient expressed understanding and agreed to proceed.   I discussed the assessment and treatment plan with the patient. The patient was provided an opportunity to ask questions and all were answered. The patient agreed with the plan and demonstrated an understanding of the instructions.   The patient was advised to call back or seek an in-person evaluation if the symptoms worsen or if the condition fails to improve as anticipated.   I provided 15 minutes of non-face-to-face time during this encounter.     Oneta Rackanika N Ames, NP      Behavioral Health Partial Program Assessment Note   Date: 08/25/2021 Name: Rebecca Vaughan MRN: [id number]       WUJ:WJXBJYNWHPI:Rebecca Vaughan is a 21 y.o. African American female presents after a recent inpatient admission due to a suicide attempted to overdose.  She reports she is currently followed by Merlinda [facility]Karen Schubert [facility] where she is currently prescribed Effexor, hydroxyzine and Topamax for mood stabilization.  States she resides with her mother and has plans to attend school to study cardiology at [employer].  Denied previous inpatient admissions.  Does admit to using marijuana occasionally.  Patient was enrolled in partial psychiatric program on 08/21/21.   Per discharge assessment note: "[name] is a 21 YO F with a history of depression and anxiety who presents s/p intentional overdose on trazodone following a series of financial, family, employment and legal stressors.On interview, the patient explains a 2 year decline. She has been a good student from a family with  several police officers in it. When her parent got into financial trouble, she stole from work to help out. She ended up with some legal trouble that is now coming to pass. She has been having increased anxiety and panic attacks. She felt responsible for this because she had medical issues from chronic pain that resulted in surgery bills contributing. She is having a hard time dealing with the guilt and shame from all of it. She is not sleeping or eating very well. She feels tense all of the time. She was started on lexapro and trazodone for sleep. She has been started with therapy already. Her energy is low. No hallucinations, paranoia or delusions. She rarely drinks alcohol and has used cannabis for her chronic pain."   Primary complaints include: anxiety, depression worse, and feeling depressed.  Onset of symptoms was gradual with gradually worsening course since that time. Psychosocial Stressors include the following: family and occupational.    I have reviewed the following documentation dated 08/25/2021: past psychiatric history and past medical history   Complaints of Pain: nonear Past Psychiatric History:  None   Currently in treatment with Effexor, trazodone and Topamax   Substance Abuse History: marijuana Use of Alcohol: denied Use of Caffeine: denies use Use of over the counter:         Past Surgical History:  Procedure Laterality Date   LAPAROTOMY N/A 05/06/2020    Procedure: EXPLORATORY LAPAROTOMY, LYSIS OF ADHESIONS, DRAINAGE OF ENDOMETRIOMAS, PARTIAL RIGHT OVARIAN CYSTECTOMY;  Surgeon: Marcelle OverlieGrewal, Michelle, MD;  [facility];  Service: Gynecology;  Laterality: N/A;  Past Medical History:  Diagnosis Date   Anxiety     Constipation     COVID 10/2019    fever chills headache x 5 days all symptoms resolved   Depression     Endometriosis     Hypothyroidism     Pneumonia as child age 65    right lung   Thyroid disease      hyperthyroid   Wears  glasses          Outpatient Encounter Medications as of 08/25/2021  Medication Sig   Elagolix Sodium (ORILISSA) 200 MG TABS Take 200 mg by mouth in the morning and at bedtime.   hydrOXYzine (ATARAX) 25 MG tablet Take 1 tablet (25 mg total) by mouth every 8 (eight) hours as needed for anxiety.   LARIN FE 1/20 1-20 MG-MCG tablet Take 1 tablet by mouth daily.   levothyroxine (SYNTHROID) 100 MCG tablet Take 100 mcg by mouth daily.   melatonin 5 MG TABS Take 1 tablet (5 mg total) by mouth at bedtime as needed (insomnia).   nicotine (NICODERM CQ - DOSED IN MG/24 HOURS) 21 mg/24hr patch Place 1 patch (21 mg total) onto the skin daily as needed (smoking cessation). (Patient not taking: Reported on 08/22/2021)   topiramate (TOPAMAX) 25 MG tablet Take 1 tablet (25 mg total) by mouth 2 (two) times daily.   venlafaxine XR (EFFEXOR-XR) 75 MG 24 hr capsule Take 1 capsule (75 mg total) by mouth daily with breakfast.   Vitamin D, Ergocalciferol, (DRISDOL) 1.25 MG (50000 UNIT) CAPS capsule Take 1 capsule (50,000 Units total) by mouth every 7 (seven) days.    No facility-administered encounter medications on file as of 08/25/2021.         Allergies  Allergen Reactions   Kiwi Extract Other (See Comments)      Lips get black scabs    Social History         Tobacco Use   Smoking status: Never   Smokeless tobacco: Never   Tobacco comments:      No smokers at home  Substance Use Topics   Alcohol use: Not Currently    Functioning Relationships: good support system Education: College       Please specify degree:  Other Pertinent History: Financial and Legal      Family History  Problem Relation Age of Onset   Anxiety disorder Mother     Other Mother     Thyroid disease Mother     Hypertension Father     Diabetes Father     Hypercholesterolemia Father     Alcohol abuse Maternal Aunt     Drug abuse Maternal Aunt     Drug abuse Maternal Uncle     Alcohol abuse Maternal Uncle     Drug abuse Maternal  Grandfather     Alcohol abuse Maternal Grandfather     Hypertension Maternal Grandmother     Drug abuse Paternal Grandfather     Alcohol abuse Paternal Grandfather     Hypertension Paternal Grandfather     Diabetes Paternal Grandfather     Hypertension Paternal Grandmother     Diabetes Paternal Grandmother        Review of Systems Constitutional: negative   Objective:   There were no vitals filed for this visit.   Physical Exam:     Mental Status Exam: Appearance:    Well groomed Psychomotor::             Within Normal Limits Attention span  and concentration:      Normal Behavior:         calm, cooperative, and adequate rapport can be established Speech:           normal volume Mood:  depressed and anxious Affect:  normal Thought Process:        Coherent Thought Content:        Logical Orientation:     person, place, and time/date Cognition:        grossly intact Insight:            Intact Judgment:       Intact Estimate of Intelligence:         Average Fund of knowledge:    Aware of current events Memory:          Recent and remote intact Abnormal movements:            None Gait and station:          Normal   Assessment:   Diagnosis: MDD (major depressive disorder), severe (HCC) [F32.2] 1. MDD (major depressive disorder), severe (HCC)       Indications for admission: inpatient care required if not in partial hospital program   Plan: Orders placed for occupational therapy (OT) patient enrolled in Partial Hospitalization Program, patient's current medications are to be continued, a comprehensive treatment plan will be developed, and side effects of medications have been reviewed with patient   Collaboration of Care: Medication Management AEB continue Effexor, trazodone and Topamax for mood stabilization   Patient/Guardian was advised Release of Information must be obtained prior to any record release in order to collaborate their care with an outside provider.  Patient/Guardian was advised if they have not already done so to contact the registration department to sign all necessary forms in order for [place] to release information regarding their care.    Consent: Patient/Guardian gives verbal consent for treatment and assignment of benefits for services provided during this visit. Patient/Guardian expressed understanding and agreed to proceed.    Treatment options and alternatives reviewed with patient and patient understands the above plan.  Treatment plan was reviewed and agreed upon by NP Chilton Greathouse and patient Alden Benjamin need for group services.

## 2021-08-28 ENCOUNTER — Ambulatory Visit (HOSPITAL_COMMUNITY): Payer: 59

## 2021-08-28 ENCOUNTER — Other Ambulatory Visit (HOSPITAL_COMMUNITY): Payer: 59

## 2021-08-29 ENCOUNTER — Encounter (HOSPITAL_COMMUNITY): Payer: Self-pay

## 2021-08-29 ENCOUNTER — Other Ambulatory Visit (HOSPITAL_COMMUNITY): Payer: 59

## 2021-08-29 ENCOUNTER — Other Ambulatory Visit (HOSPITAL_COMMUNITY): Payer: 59 | Admitting: Licensed Clinical Social Worker

## 2021-08-29 DIAGNOSIS — F322 Major depressive disorder, single episode, severe without psychotic features: Secondary | ICD-10-CM

## 2021-08-29 DIAGNOSIS — R4589 Other symptoms and signs involving emotional state: Secondary | ICD-10-CM

## 2021-08-29 NOTE — Therapy (Signed)
[facility] [place], [place], [address] Phone: [phone]   Fax:  [phone]  Occupational Therapy Treatment Virtual Visit via Video Note  I connected with Rebecca Vaughan on 08/29/21 at  8:00 AM EDT by a video enabled telemedicine application and verified that I am speaking with the correct person using two identifiers.  Location: Patient: home Provider: office   I discussed the limitations of evaluation and management by telemedicine and the availability of in person appointments. The patient expressed understanding and agreed to proceed.    The patient was advised to call back or seek an in-person evaluation if the symptoms worsen or if the condition fails to improve as anticipated.  I provided 55 minutes of non-face-to-face time during this encounter.   Patient Details  Name: Rebecca Vaughan MRN: [id number] Date of Birth: 2000/06/29 No data recorded  Encounter Date: 08/29/2021   OT End of Session - 08/29/21 1348     Visit Number 5    Number of Visits 20    Date for OT Re-Evaluation 09/20/21    Authorization Type AETNA AETNA NAP  [id number] [place]    OT Start Time 1200    OT Stop Time 1255    OT Time Calculation (min) 55 min             Past Medical History:  Diagnosis Date   Anxiety    Constipation    COVID 10/2019   fever chills headache x 5 days all symptoms resolved   Depression    Endometriosis    Hypothyroidism    Pneumonia as child age 89   right lung   Thyroid disease    hyperthyroid   Wears glasses     Past Surgical History:  Procedure Laterality Date   LAPAROTOMY N/A 05/06/2020   Procedure: EXPLORATORY LAPAROTOMY, LYSIS OF ADHESIONS, DRAINAGE OF ENDOMETRIOMAS, PARTIAL RIGHT OVARIAN CYSTECTOMY;  Surgeon: Dian Queen, MD;  Location: [facility];  Service: Gynecology;  Laterality: N/A;    There were no vitals filed  for this visit.   Subjective Assessment - 08/29/21 1348     Currently in Pain? No/denies    Pain Score 0-No pain             Group Session:  S: "I'm doing okay today"  O: During the group session conducted via telehealth, participants engaged in a comprehensive discussion on the topic of boundaries, specifically focusing on adults who suffer from depression, anxiety, and other mental health problems. The session began with an introduction to the concept of boundaries and their importance in maintaining emotional well-being. The facilitator explained that boundaries are guidelines that individuals establish to protect themselves from emotional, physical, and time-related stressors.  The group explored the three main types of boundaries: physical, emotional, and time sensitive. Physical boundaries refer to personal space and the physical limits individuals set for themselves. Emotional boundaries involve recognizing and respecting one's own feelings and those of others, ensuring that emotions are not disregarded or invalidated. Time-sensitive boundaries revolve around managing and allocating time effectively to avoid feeling overwhelmed or stretched too thin.  The participants also discussed the benefits of setting boundaries and how to assert them in different situations. They learned that boundaries could enhance self-esteem, reduce stress, improve relationships, and promote self-care. Techniques for asserting boundaries were shared, such as clear communication, assertiveness, and practicing self-awareness. The overall objective of the session was to equip  participants with the knowledge and skills to set and maintain healthy boundaries, ultimately enhancing their emotional well-being and daily functioning in various areas of life.  A: Patient exhibited a more passive level of engagement throughout the session. While they were present and listened attentively to the information shared by the  facilitator and other group members, Patient did not actively contribute to the discussion on boundaries. They remained relatively quiet during the exploration of physical, emotional, and time-sensitive boundaries, offering minimal input or personal experiences. Patient's body language indicated a lack of enthusiasm or active participation, with limited eye contact and few non-verbal cues indicating their interest. Although they did not disrupt the session, their passive engagement made it challenging to assess their level of understanding or their willingness to implement the strategies discussed. It is important to encourage patient to actively participate in future sessions to ensure they receive the full benefits of the group therapy experience and the valuable information shared regarding boundaries.  P: Continue to attend PHP OT group sessions 5x week for 4 weeks to promote daily structure, social engagement, and opportunities to develop and utilize adaptive strategies to maximize functional performance in preparation for safe transition and integration back into school, work, and the community. Plan to address topic of Boundaries for Improved Activities of Daily Living 2/2 in next OT group session.                      OT Education - 08/29/21 1348     Education Details Boundaries for Improved Activities of Daily Living              OT Short Term Goals - 08/21/21 1850       OT SHORT TERM GOAL #1   Title Pt will demonstrate independence in psychosocial skill areas in order to successfully engage in community reentry upon DC    Time 4    Period Weeks    Status New    Target Date 09/20/21      OT SHORT TERM GOAL #2   Title Pt will demonstrate improved coping skills in order to manage iADLs and [place] at time of discharge      OT SHORT TERM GOAL #3   Title pt to demonstrate independence w/ time mgmt and goal setting for iADLs at time of discharge from this [facility] program.                       Plan - 08/29/21 1348     Psychosocial Skills Coping Strategies;Habits;Interpersonal Interaction;Routines and Behaviors             Patient will benefit from skilled therapeutic intervention in order to improve the following deficits and impairments:       Psychosocial Skills: Coping Strategies, Habits, Interpersonal Interaction, Routines and Behaviors   Visit Diagnosis: Difficulty coping    Problem List Patient Active Problem List   Diagnosis Date Noted   Anxiety disorder, unspecified 08/06/2021   Depression 08/06/2021   MDD (major depressive disorder), severe ([place]) 08/06/2021   Constipation 01/05/2021   TOA (tubo-ovarian abscess) 01/05/2021   Sepsis ([place]) 01/01/2021   Endometriosis 05/06/2020   Hypothyroid 04/22/2020   Hypothyroidism, acquired, 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

## 2021-08-30 ENCOUNTER — Encounter (HOSPITAL_COMMUNITY): Payer: Self-pay | Admitting: Family

## 2021-08-30 ENCOUNTER — Ambulatory Visit (HOSPITAL_COMMUNITY): Payer: 59

## 2021-08-30 ENCOUNTER — Other Ambulatory Visit (HOSPITAL_COMMUNITY): Payer: 59

## 2021-08-30 NOTE — Progress Notes (Signed)
Virtual Visit via Video Note  I connected with Rebecca Vaughan on 08/30/21 at  9:00 AM EDT by a video enabled telemedicine application and verified that I am speaking with the correct person using two identifiers.  Location: Patient: home Provider: Office   I discussed the limitations of evaluation and management by telemedicine and the availability of in person appointments. The patient expressed understanding and agreed to proceed.    I discussed the assessment and treatment plan with the patient. The patient was provided an opportunity to ask questions and all were answered. The patient agreed with the plan and demonstrated an understanding of the instructions.   The patient was advised to call back or seek an in-person evaluation if the symptoms worsen or if the condition fails to improve as anticipated.  I provided 15 minutes of non-face-to-face time during this encounter.   Derrill Center, NP   BH MD/PA/NP OP Progress Note  08/30/2021 4:19 PM Rebecca Vaughan  MRN:  [id number]  Chief Complaint: Rebecca Vaughan reported " I am not feeling well."  HPI: Rebecca Vaughan was seen and evaluated via WebEx.  States she is not feeling well due to menstrual pain.  She is denying suicidal homicidal ideations.  Denies auditory visual hallucinations.  She presents flat guarded and depressed.  States overall her anxiety has improved.  States she is enjoying learning additional coping skills to help her deal with stress.  She is currently prescribed Effexor, Topamax and trazodone.  She reports taking and tolerating well.  Reports a good appetite.  States she is resting well throughout the night.  Support,encouragement and reassurance was provided. Visit Diagnosis:    ICD-10-CM   1. MDD (major depressive disorder), severe ([place])  F32.2     2. Difficulty coping  R45.89       Past Psychiatric History:   Past Medical History:  Past Medical History:  Diagnosis Date   Anxiety    Constipation    COVID  10/2019   fever chills headache x 5 days all symptoms resolved   Depression    Endometriosis    Hypothyroidism    Pneumonia as child age 57   right lung   Thyroid disease    hyperthyroid   Wears glasses     Past Surgical History:  Procedure Laterality Date   LAPAROTOMY N/A 05/06/2020   Procedure: EXPLORATORY LAPAROTOMY, LYSIS OF ADHESIONS, DRAINAGE OF ENDOMETRIOMAS, PARTIAL RIGHT OVARIAN CYSTECTOMY;  Surgeon: Dian Queen, MD;  Location: [facility];  Service: Gynecology;  Laterality: N/A;    Family Psychiatric History:   Family History:  Family History  Problem Relation Age of Onset   Anxiety disorder Mother    Other Mother    Thyroid disease Mother    Hypertension Father    Diabetes Father    Hypercholesterolemia Father    Alcohol abuse Maternal Aunt    Drug abuse Maternal Aunt    Drug abuse Maternal Uncle    Alcohol abuse Maternal Uncle    Drug abuse Maternal Grandfather    Alcohol abuse Maternal Grandfather    Hypertension Maternal Grandmother    Drug abuse Paternal Grandfather    Alcohol abuse Paternal Grandfather    Hypertension Paternal Grandfather    Diabetes Paternal Grandfather    Hypertension Paternal Grandmother    Diabetes Paternal Grandmother     Social History:  Social History   Socioeconomic History   Marital status: Single    Spouse name: Not on file   Number of  children: 0   Years of education: 13   Highest education level: Some college, no degree  Occupational History   Not on file  Tobacco Use   Smoking status: Never   Smokeless tobacco: Never   Tobacco comments:    No smokers at home  Vaping Use   Vaping Use: Never used  Substance and Sexual Activity   Alcohol use: Not Currently   Drug use: Yes    Types: Marijuana   Sexual activity: Yes    Birth control/protection: Pill  Other Topics Concern   Not on file  Social History Narrative   Is in 8th grade at [employer]   Patient  currently attends [employer]. 08/06/2021   Social Determinants of Health   Financial Resource Strain: Not on file  Food Insecurity: Not on file  Transportation Needs: Not on file  Physical Activity: Not on file  Stress: Not on file  Social Connections: Not on file    Allergies:  Allergies  Allergen Reactions   Kiwi Extract Other (See Comments)    Lips get black scabs    Metabolic Disorder Labs: Lab Results  Component Value Date   HGBA1C 5.6 08/08/2021   MPG 114.02 08/08/2021   MPG 126 (H) 12/16/2014   No results found for: "PROLACTIN" No results found for: "CHOL", "TRIG", "HDL", "CHOLHDL", "VLDL", "LDLCALC" Lab Results  Component Value Date   TSH 2.470 08/07/2021   TSH 3.218 12/16/2014    Therapeutic Level Labs: No results found for: "LITHIUM" No results found for: "VALPROATE" No results found for: "CBMZ"  Current Medications: Current Outpatient Medications  Medication Sig Dispense Refill   Elagolix Sodium (ORILISSA) 200 MG TABS Take 200 mg by mouth in the morning and at bedtime.     hydrOXYzine (ATARAX) 25 MG tablet Take 1 tablet (25 mg total) by mouth every 8 (eight) hours as needed for anxiety. 30 tablet 0   LARIN FE 1/20 1-20 MG-MCG tablet Take 1 tablet by mouth daily.     levothyroxine (SYNTHROID) 100 MCG tablet Take 100 mcg by mouth daily.     melatonin 5 MG TABS Take 1 tablet (5 mg total) by mouth at bedtime as needed (insomnia). 30 tablet 0   nicotine (NICODERM CQ - DOSED IN MG/24 HOURS) 21 mg/24hr patch Place 1 patch (21 mg total) onto the skin daily as needed (smoking cessation). (Patient not taking: Reported on 08/22/2021) 28 patch 0   topiramate (TOPAMAX) 25 MG tablet Take 1 tablet (25 mg total) by mouth 2 (two) times daily. 60 tablet 0   venlafaxine XR (EFFEXOR-XR) 75 MG 24 hr capsule Take 1 capsule (75 mg total) by mouth daily with breakfast. 30 capsule 0   Vitamin D, Ergocalciferol, (DRISDOL) 1.25 MG (50000 UNIT) CAPS capsule Take 1 capsule (50,000 Units total) by mouth every 7  (seven) days. 4 capsule 0   No current facility-administered medications for this visit.     Musculoskeletal: Strength & Muscle Tone: within normal limits Gait & Station: normal Patient leans: N/A  Psychiatric Specialty Exam: Review of Systems  Constitutional: Negative.   Cardiovascular: Negative.   Musculoskeletal: Negative.   Psychiatric/Behavioral:  Positive for decreased concentration and sleep disturbance (improvng). The patient is nervous/anxious.   All other systems reviewed and are negative.   Last menstrual period 07/08/2021.There is no height or weight on file to calculate BMI.  General Appearance: Casual  Eye Contact:  Good  Speech:  Clear and Coherent  Volume:  Normal  Mood:  Anxious and Depressed  Affect:  Congruent  Thought Process:  Coherent  Orientation:  Full (Time, Place, and Person)  Thought Content: Logical   Suicidal Thoughts:  No  Homicidal Thoughts:  No  Memory:  Immediate;   Good Recent;   Good  Judgement:  Good  Insight:  Good  Psychomotor Activity:  Normal  Concentration:  Concentration: Good  Recall:  Good  Fund of Knowledge: Good  Language: Good  Akathisia:  No  Handed:  Right  AIMS (if indicated): done  Assets:  Communication Skills Desire for Improvement Resilience Social Support  ADL's:  Intact  Cognition: WNL  Sleep:  Good   Screenings: AUDIT    Flowsheet Row Admission (Discharged) from 08/06/2021 in [facility]  Alcohol Use Disorder Identification Test Final Score (AUDIT) 2      PHQ2-9    Flowsheet Row Counselor from 08/22/2021 in [facility] Counselor from 08/16/2021 in [facility] ED from 08/06/2021 in [facility]  PHQ-2 Total Score 3 4 6   PHQ-9 Total Score 12 18 11       Flowsheet Row Counselor from 08/16/2021 in [facility] Most recent  reading at 08/16/2021 10:57 AM Admission (Discharged) from 08/06/2021 in [facility] Most recent reading at 08/09/2021  1:06 PM ED from 08/06/2021 in [facility] Most recent reading at 08/06/2021  6:48 AM  C-SSRS RISK CATEGORY High Risk No Risk High Risk        Assessment and Plan:  Continue partial hospitalization programming Continue medications as indicated  Collaboration of Care: Collaboration of Care: Medication Management AEB continue Effexor and Topamax as directed  Patient/Guardian was advised Release of Information must be obtained prior to any record release in order to collaborate their care with an outside provider. Patient/Guardian was advised if they have not already done so to contact the registration department to sign all necessary forms in order for [place] to release information regarding their care.   Consent: Patient/Guardian gives verbal consent for treatment and assignment of benefits for services provided during this visit. Patient/Guardian expressed understanding and agreed to proceed.    Derrill Center, NP 08/30/2021, 4:19 PM

## 2021-08-31 ENCOUNTER — Other Ambulatory Visit (HOSPITAL_COMMUNITY): Payer: 59

## 2021-08-31 ENCOUNTER — Other Ambulatory Visit (HOSPITAL_COMMUNITY): Payer: 59 | Admitting: Licensed Clinical Social Worker

## 2021-08-31 ENCOUNTER — Encounter (HOSPITAL_COMMUNITY): Payer: Self-pay

## 2021-08-31 DIAGNOSIS — F322 Major depressive disorder, single episode, severe without psychotic features: Secondary | ICD-10-CM

## 2021-08-31 DIAGNOSIS — R4589 Other symptoms and signs involving emotional state: Secondary | ICD-10-CM

## 2021-08-31 NOTE — Therapy (Signed)
[facility] PARTIAL HOSPITALIZATION PROGRAM [address] [place], [place], [address] Phone: [phone]   Fax:  [phone]  Occupational Therapy Treatment  Virtual Visit via Video Note  I connected with Rebecca Vaughan on 08/31/21 at  8:00 AM EDT by a video enabled telemedicine application and verified that I am speaking with the correct person using two identifiers.  Location: Patient: home Provider: office   I discussed the limitations of evaluation and management by telemedicine and the availability of in person appointments. The patient expressed understanding and agreed to proceed.    The patient was advised to call back or seek an in-person evaluation if the symptoms worsen or if the condition fails to improve as anticipated.  I provided 60 minutes of non-face-to-face time during this encounter.   Patient Details  Name: Rebecca Vaughan MRN: [id number] Date of Birth: [birth year] No data recorded  Encounter Date: 08/31/2021   OT End of Session - 08/31/21 1252     Visit Number 6    Number of Visits 20    Date for OT Re-Evaluation 09/20/21    Authorization Type AETNA AETNA NAP  [id number] [place]    OT Start Time 1100    OT Stop Time 1200    OT Time Calculation (min) 60 min             Past Medical History:  Diagnosis Date   Anxiety    Constipation    COVID 10/2019   fever chills headache x 5 days all symptoms resolved   Depression    Endometriosis    Hypothyroidism    Pneumonia as child age 80   right lung   Thyroid disease    hyperthyroid   Wears glasses     Past Surgical History:  Procedure Laterality Date   LAPAROTOMY N/A 05/06/2020   Procedure: EXPLORATORY LAPAROTOMY, LYSIS OF ADHESIONS, DRAINAGE OF ENDOMETRIOMAS, PARTIAL RIGHT OVARIAN CYSTECTOMY;  Surgeon: Marcelle Overlie, MD;  Location: [facility];  Service: Gynecology;  Laterality: N/A;    There were no vitals  filed for this visit.   Subjective Assessment - 08/31/21 1251     Currently in Pain? No/denies    Pain Score 0-No pain               Group Session:  S: "I'm here. Nothing much to really talk about today. I'll just listen"  O: The patient, a 21 year old female diagnosed with MDD, reported during the telehealth session struggling to perform basic self-care tasks, including getting out of bed in the morning. The occupational therapist provided education on the impact of depression on self-care and discussed ten specific strategies to improve self-care. The therapist explained how using a combination of proprioception and gravity can help the patient to move and overcome difficulties with getting out of bed in the morning. The therapist demonstrated how placing one leg off the bed while in supine can provide proprioception feedback and promote movement to further facilitate movement to the edge of the bed and ultimately sitting up in bed. The therapist discussed the importance of gradual exposure, starting with small movements and gradually increasing the intensity of the movements, to help the patient overcome their difficulties. The therapist also discussed the use of other strategies, such as using a morning routine, breaking down tasks into smaller steps, and positive self-talk, to increase motivation and make the task of getting out of bed more manageable.  A: Due to  the minimal engagement on part the patient, patient demonstrates a limited understanding of the specific strategies discussed to improve self-care, including the use of prioritizing, operating w/in the limitations in which you find yourself in for any given day and even the concept proprioception and gravity technique, and expressed interest in implementing them. The occupational therapist will continue to assess progress in implementing these strategies and provide additional support as needed in future telehealth sessions. The  therapist will also continue to address the impact of depression on self-care and explore any additional barriers that may be hindering the patient's ability to engage in self-care activities, including getting out of bed in the morning. OT to encourage pt to increase participation in future groups.   P: Continue to attend PHP OT group sessions 5x week for 4 weeks to promote daily structure, social engagement, and opportunities to develop and utilize adaptive strategies to maximize functional performance in preparation for safe transition and integration back into school, work, and the community. Plan to address topic of Self Care and Depression 2/2 in next OT group session.                    OT Education - 08/31/21 1252     Education Details Self Care and Depression 1/2              OT Short Term Goals - 08/21/21 1850       OT SHORT TERM GOAL #1   Title Pt will demonstrate independence in psychosocial skill areas in order to successfully engage in community reentry upon DC    Time 4    Period Weeks    Status New    Target Date 09/20/21      OT SHORT TERM GOAL #2   Title Pt will demonstrate improved coping skills in order to manage iADLs and [place] at time of discharge      OT SHORT TERM GOAL #3   Title pt to demonstrate independence w/ time mgmt and goal setting for iADLs at time of discharge from this [facility] program.                      Plan - 08/31/21 1252     Psychosocial Skills Coping Strategies;Habits;Interpersonal Interaction;Routines and Behaviors             Patient will benefit from skilled therapeutic intervention in order to improve the following deficits and impairments:       Psychosocial Skills: Coping Strategies, Habits, Interpersonal Interaction, Routines and Behaviors   Visit Diagnosis: Difficulty coping    Problem List Patient Active Problem List   Diagnosis Date Noted   Anxiety disorder, unspecified 08/06/2021    Depression 08/06/2021   MDD (major depressive disorder), severe (HCC) 08/06/2021   Constipation 01/05/2021   TOA (tubo-ovarian abscess) 01/05/2021   Sepsis (HCC) 01/01/2021   Endometriosis 05/06/2020   Hypothyroid 04/22/2020   Hypothyroidism, acquired, 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   Acanthosis 12/16/2014   Pediatric overweight 12/16/2014    Ted Mcalpine, OT 08/31/2021, 12:53 PM  Kerrin Champagne, OT   [facility] HOSPITALIZATION PROGRAM [address] [place], [place], [address] Phone: [phone]   Fax:  [phone]  Name: Rebecca Vaughan MRN: [id number] Date of Birth: 2000-08-12

## 2021-09-01 ENCOUNTER — Encounter (HOSPITAL_COMMUNITY): Payer: Self-pay

## 2021-09-01 ENCOUNTER — Other Ambulatory Visit (HOSPITAL_COMMUNITY): Payer: 59 | Admitting: Licensed Clinical Social Worker

## 2021-09-01 ENCOUNTER — Other Ambulatory Visit (HOSPITAL_COMMUNITY): Payer: 59

## 2021-09-01 DIAGNOSIS — F322 Major depressive disorder, single episode, severe without psychotic features: Secondary | ICD-10-CM | POA: Diagnosis not present

## 2021-09-01 DIAGNOSIS — R4589 Other symptoms and signs involving emotional state: Secondary | ICD-10-CM

## 2021-09-01 NOTE — Therapy (Signed)
[facility] PARTIAL HOSPITALIZATION PROGRAM [address] [place], [place], [address] Phone: [phone]   Fax:  [phone]  Occupational Therapy Treatment Virtual Visit via Video Note  I connected with Sunday Corn on 09/01/21 at  8:00 AM EDT by a video enabled telemedicine application and verified that I am speaking with the correct person using two identifiers.  Location: Patient: home Provider: office   I discussed the limitations of evaluation and management by telemedicine and the availability of in person appointments. The patient expressed understanding and agreed to proceed.    The patient was advised to call back or seek an in-person evaluation if the symptoms worsen or if the condition fails to improve as anticipated.  I provided 60 minutes of non-face-to-face time during this encounter.   Patient Details  Name: CIARRAH RAE MRN: [id number] Date of Birth: [birth year] No data recorded  Encounter Date: 09/01/2021   OT End of Session - 09/01/21 1227     Visit Number 7    Number of Visits 20    Date for OT Re-Evaluation 09/20/21    Authorization Type AETNA AETNA NAP  [id number] [place]    OT Start Time 1100    OT Stop Time 1200    OT Time Calculation (min) 60 min             Past Medical History:  Diagnosis Date   Anxiety    Constipation    COVID 10/2019   fever chills headache x 5 days all symptoms resolved   Depression    Endometriosis    Hypothyroidism    Pneumonia as child age 77   right lung   Thyroid disease    hyperthyroid   Wears glasses     Past Surgical History:  Procedure Laterality Date   LAPAROTOMY N/A 05/06/2020   Procedure: EXPLORATORY LAPAROTOMY, LYSIS OF ADHESIONS, DRAINAGE OF ENDOMETRIOMAS, PARTIAL RIGHT OVARIAN CYSTECTOMY;  Surgeon: Marcelle Overlie, MD;  Location: [facility] [place];  Service: Gynecology;  Laterality: N/A;    There were no vitals filed  for this visit.   Subjective Assessment - 09/01/21 1227     Currently in Pain? No/denies    Pain Score 0-No pain                Group Session:  S: "Feeling better I guess"  O: The patient, a 21 year old female diagnosed with anxiety and major depression, reported during the telehealth session struggling to perform basic self-care tasks, including getting out of bed in the morning. The occupational therapist provided education on the impact of depression on self-care and discussed ten specific strategies to improve self-care. The therapist explained how using a combination of proprioception and gravity can help the patient to move and overcome difficulties with getting out of bed in the morning. The therapist demonstrated how placing one leg off the bed while in supine can provide proprioception feedback and promote movement to further facilitate movement to the edge of the bed and ultimately sitting up in bed. The therapist discussed the importance of gradual exposure, starting with small movements and gradually increasing the intensity of the movements, to help the patient overcome their difficulties. The therapist also discussed the use of other strategies, such as using a morning routine, breaking down tasks into smaller steps, and positive self-talk, to increase motivation and make the task of getting out of bed more manageable.  A: The patient demonstrated a fair understanding of  the specific strategies despite having remained mostly quiet and passive / off camera for most of the session, discussed to improve self-care, including the use of prioritizing, operating w/in the limitations in which you find yourself in for any given day and even the concept proprioception and gravity technique, and expressed interest in implementing them. The occupational therapist will continue to assess progress in implementing these strategies and provide additional support as needed in future telehealth  sessions. The therapist will also continue to address the impact of depression on self-care and explore any additional barriers that may be hindering the patient's ability to engage in self-care activities, including getting out of bed in the morning.  P: Continue to attend PHP OT group sessions 5x week for 4 weeks to promote daily structure, social engagement, and opportunities to develop and utilize adaptive strategies to maximize functional performance in preparation for safe transition and integration back into school, work, and the community. Plan to address topic of tbd in next OT group session.                   OT Education - 09/01/21 1227     Education Details Self Care and Depression 2/2              OT Short Term Goals - 08/21/21 1850       OT SHORT TERM GOAL #1   Title Pt will demonstrate independence in psychosocial skill areas in order to successfully engage in community reentry upon DC    Time 4    Period Weeks    Status New    Target Date 09/20/21      OT SHORT TERM GOAL #2   Title Pt will demonstrate improved coping skills in order to manage iADLs and [place] at time of discharge      OT SHORT TERM GOAL #3   Title pt to demonstrate independence w/ time mgmt and goal setting for iADLs at time of discharge from this [facility] program.                      Plan - 09/01/21 1228     Occupational performance deficits (Please refer to evaluation for details): IADL's;Rest and Sleep;Education;Work;Social Participation    Psychosocial Skills Coping Strategies;Habits;Interpersonal Interaction;Routines and Behaviors             Patient will benefit from skilled therapeutic intervention in order to improve the following deficits and impairments:       Psychosocial Skills: Coping Strategies, Habits, Interpersonal Interaction, Routines and Behaviors   Visit Diagnosis: Difficulty coping    Problem List Patient Active Problem List   Diagnosis  Date Noted   Anxiety disorder, unspecified 08/06/2021   Depression 08/06/2021   MDD (major depressive disorder), severe (HCC) 08/06/2021   Constipation 01/05/2021   TOA (tubo-ovarian abscess) 01/05/2021   Sepsis (HCC) 01/01/2021   Endometriosis 05/06/2020   Hypothyroid 04/22/2020   Hypothyroidism, acquired, 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   Acanthosis 12/16/2014   Pediatric overweight 12/16/2014    Ted Mcalpine, OT 09/01/2021, 12:29 PM Kerrin Champagne, OT   [facility] HOSPITALIZATION PROGRAM [address] [place], [place], [address] Phone: [phone]   Fax:  [phone]  Name: HARLYM GEHLING MRN: [id number] Date of Birth: [birth year]

## 2021-09-04 ENCOUNTER — Other Ambulatory Visit (HOSPITAL_COMMUNITY): Payer: 59

## 2021-09-04 ENCOUNTER — Other Ambulatory Visit (HOSPITAL_COMMUNITY): Payer: 59 | Admitting: Professional

## 2021-09-04 ENCOUNTER — Encounter (HOSPITAL_COMMUNITY): Payer: Self-pay

## 2021-09-04 DIAGNOSIS — F322 Major depressive disorder, single episode, severe without psychotic features: Secondary | ICD-10-CM | POA: Diagnosis not present

## 2021-09-04 DIAGNOSIS — R4589 Other symptoms and signs involving emotional state: Secondary | ICD-10-CM

## 2021-09-04 NOTE — Therapy (Signed)
[facility] PARTIAL HOSPITALIZATION PROGRAM [address] [place], [place], [address] Phone: [phone]   Fax:  [phone]  Occupational Therapy Treatment Virtual Visit via Video Note  I connected with Rebecca Vaughan on 09/04/21 at  8:00 AM EDT by a video enabled telemedicine application and verified that I am speaking with the correct person using two identifiers.  Location: Patient: home Provider: office   I discussed the limitations of evaluation and management by telemedicine and the availability of in person appointments. The patient expressed understanding and agreed to proceed.    The patient was advised to call back or seek an in-person evaluation if the symptoms worsen or if the condition fails to improve as anticipated.  I provided 60 minutes of non-face-to-face time during this encounter.   Patient Details  Name: Rebecca Vaughan MRN: [id number] Date of Birth: Nov 04, 2000 No data recorded  Encounter Date: 09/04/2021   OT End of Session - 09/04/21 1249     Visit Number 8    Number of Visits 20    Date for OT Re-Evaluation 09/20/21    Authorization Type AETNA AETNA NAP  [id number] [place]    OT Start Time 1100    OT Stop Time 1200    OT Time Calculation (min) 60 min             Past Medical History:  Diagnosis Date   Anxiety    Constipation    COVID 10/2019   fever chills headache x 5 days all symptoms resolved   Depression    Endometriosis    Hypothyroidism    Pneumonia as child age 12   right lung   Thyroid disease    hyperthyroid   Wears glasses     Past Surgical History:  Procedure Laterality Date   LAPAROTOMY N/A 05/06/2020   Procedure: EXPLORATORY LAPAROTOMY, LYSIS OF ADHESIONS, DRAINAGE OF ENDOMETRIOMAS, PARTIAL RIGHT OVARIAN CYSTECTOMY;  Surgeon: Marcelle Overlie, MD;  Location: [facility];  Service: Gynecology;  Laterality: N/A;    There were no vitals filed  for this visit.   Subjective Assessment - 09/04/21 1248     Currently in Pain? No/denies    Pain Score 0-No pain                 Group Session:  S: "I'm here"  O: The objective of the telehealth group therapy session was to discuss the significance of routines in promoting mental health and wellbeing. The therapist aimed to explore the power of routines in providing structure, stability, and predictability in individuals' lives, as well as their role in establishing healthy habits, reducing stress and anxiety, managing time effectively, achieving goals, and fostering a sense of community and social connectedness. Participants were guided to identify areas where routines could improve their mental health and were provided with tips for establishing and maintaining healthy routines. The session concluded by emphasizing the potential of routines to enhance overall quality of life.  A: During the telehealth group therapy session, the patient passively participated in the discussion on the importance of routines in promoting mental health and wellbeing. The patient demonstrated a limited and minimal understanding of the power of routines in providing structure, stability, and predictability in their life. Pt was not able to identify any areas in their life where routines could contribute to improving their overall mental health.  The patient showed only minimal motivation and willingness to reflect on their current habits  and behaviors, recognizing the need for positive changes. She only passively engaged in the session, did not share any personal experiences and challenges related to establishing and maintaining healthy routines. The patient did not express a desire to reduce stress and anxiety, manage their time more effectively, and achieve their goals through the implementation of routines.   Based on the patient's passive participation, there seems to be at least a minimal understanding  of the concepts discussed, and their motivation to implement positive changes in their life, it can be assessed that they have at least limited potential to benefit from incorporating healthy routines into their daily life. The patient's lack of apparent commitment to reflect on their current routines and selecting an area for improvement indicates a neutral approach to their mental health and wellbeing. Continued support and guidance in implementing and maintaining the identified routine will be beneficial for the patient's overall progress.   P: Continue to attend PHP OT group sessions 5x week for 4 weeks to promote daily structure, social engagement, and opportunities to develop and utilize adaptive strategies to maximize functional performance in preparation for safe transition and integration back into school, work, and the community. Plan to address topic of The Power of Routines 2/2 in next OT group session.                  OT Education - 09/04/21 1248     Education Details The Power of Routines 1/2              OT Short Term Goals - 08/21/21 1850       OT SHORT TERM GOAL #1   Title Pt will demonstrate independence in psychosocial skill areas in order to successfully engage in community reentry upon DC    Time 4    Period Weeks    Status New    Target Date 09/20/21      OT SHORT TERM GOAL #2   Title Pt will demonstrate improved coping skills in order to manage iADLs and [place] at time of discharge      OT SHORT TERM GOAL #3   Title pt to demonstrate independence w/ time mgmt and goal setting for iADLs at time of discharge from this [facility] program.                      Plan - 09/04/21 1249     Psychosocial Skills Coping Strategies;Habits;Interpersonal Interaction;Routines and Behaviors             Patient will benefit from skilled therapeutic intervention in order to improve the following deficits and impairments:       Psychosocial Skills:  Coping Strategies, Habits, Interpersonal Interaction, Routines and Behaviors   Visit Diagnosis: Difficulty coping    Problem List Patient Active Problem List   Diagnosis Date Noted   Anxiety disorder, unspecified 08/06/2021   Depression 08/06/2021   MDD (major depressive disorder), severe (HCC) 08/06/2021   Constipation 01/05/2021   TOA (tubo-ovarian abscess) 01/05/2021   Sepsis (HCC) 01/01/2021   Endometriosis 05/06/2020   Hypothyroid 04/22/2020   Hypothyroidism, acquired, 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   Acanthosis 12/16/2014   Pediatric overweight 12/16/2014    Ted Mcalpine, OT 09/04/2021, 12:49 PM  Kerrin Champagne, OT   [facility] HOSPITALIZATION PROGRAM [address] [place], [place], [address] Phone: [phone]   Fax:  [phone]  Name: Rebecca Vaughan MRN: [id number] Date of Birth: Jun 29, 2000

## 2021-09-05 ENCOUNTER — Other Ambulatory Visit (HOSPITAL_COMMUNITY): Payer: 59 | Admitting: Professional

## 2021-09-05 ENCOUNTER — Other Ambulatory Visit (HOSPITAL_COMMUNITY): Payer: 59

## 2021-09-05 ENCOUNTER — Encounter (HOSPITAL_COMMUNITY): Payer: Self-pay

## 2021-09-05 DIAGNOSIS — F322 Major depressive disorder, single episode, severe without psychotic features: Secondary | ICD-10-CM

## 2021-09-05 DIAGNOSIS — R4589 Other symptoms and signs involving emotional state: Secondary | ICD-10-CM

## 2021-09-05 NOTE — Therapy (Signed)
[facility] PARTIAL HOSPITALIZATION PROGRAM [address] [place], [place], [address] Phone: [phone]   Fax:  [phone]  Occupational Therapy Treatment Virtual Visit via Video Note  I connected with Rebecca Vaughan on 09/05/21 at  8:00 AM EDT by a video enabled telemedicine application and verified that I am speaking with the correct person using two identifiers.  Location: Patient: home Provider: office   I discussed the limitations of evaluation and management by telemedicine and the availability of in person appointments. The patient expressed understanding and agreed to proceed.    The patient was advised to call back or seek an in-person evaluation if the symptoms worsen or if the condition fails to improve as anticipated.  I provided 50 minutes of non-face-to-face time during this encounter.   Patient Details  Name: Rebecca Vaughan MRN: [id number] Date of Birth: [birth year] No data recorded  Encounter Date: 09/05/2021   OT End of Session - 09/05/21 1352     Visit Number 9    Number of Visits 20    Date for OT Re-Evaluation 09/20/21    Authorization Type AETNA AETNA NAP  [id number] [place]    OT Start Time 1200    OT Stop Time 1250    OT Time Calculation (min) 50 min             Past Medical History:  Diagnosis Date   Anxiety    Constipation    COVID 10/2019   fever chills headache x 5 days all symptoms resolved   Depression    Endometriosis    Hypothyroidism    Pneumonia as child age 13   right lung   Thyroid disease    hyperthyroid   Wears glasses     Past Surgical History:  Procedure Laterality Date   LAPAROTOMY N/A 05/06/2020   Procedure: EXPLORATORY LAPAROTOMY, LYSIS OF ADHESIONS, DRAINAGE OF ENDOMETRIOMAS, PARTIAL RIGHT OVARIAN CYSTECTOMY;  Surgeon: Marcelle Overlie, MD;  Location: [facility];  Service: Gynecology;  Laterality: N/A;    There were no vitals filed  for this visit.   Subjective Assessment - 09/05/21 1352     Currently in Pain? No/denies    Pain Score 0-No pain    Multiple Pain Sites No                 Group Session:  S: "I'm doing better. I have been sticking to my routine of going to the gym several times a week where I will do stretching and isolation exercises at least 4 days a week. I really do notice a difference when I skip a day and definitely notice a negative change if I skip several days in a row. Low energy and low motivation. My depression increases, so I need to get my body moving"  O: The objective of the telehealth group therapy session was to discuss the significance of routines in promoting mental health and wellbeing. The therapist aimed to explore the power of routines in providing structure, stability, and predictability in individuals' lives, as well as their role in establishing healthy habits, reducing stress and anxiety, managing time effectively, achieving goals, and fostering a sense of community and social connectedness. Participants were guided to identify areas where routines could improve their mental health and were provided with tips for establishing and maintaining healthy routines. The session concluded by emphasizing the potential of routines to enhance overall quality of life.  A: During the telehealth  group therapy session, the patient actively participated in the discussion on the importance of routines in promoting mental health and wellbeing. The patient demonstrated a good understanding of the power of routines in providing structure, stability, and predictability in their life. They were able to identify areas in their life where routines could contribute to improving their overall mental health.  The patient showed improved motivation and willingness today to reflect on their current habits and behaviors, recognizing the need for positive changes. They actively engaged in the session, sharing  personal experiences and challenges related to establishing and maintaining healthy routines. The patient expressed a desire to reduce stress and anxiety, manage their time more effectively, and achieve their goals through the implementation of routines.  Based on the patient's active participation today, understanding of the concepts discussed, and their motivation to implement positive changes in their life, it can be assessed that they have a good potential to benefit from incorporating healthy routines into their daily life. The patient's commitment to reflecting on their current routines and selecting an area for improvement indicates a proactive approach to their mental health and wellbeing. Continued support and guidance in implementing and maintaining the identified routine will be beneficial for the patient's overall progress.  P: Continue to attend PHP OT group sessions 5x week for 4 weeks to promote daily structure, social engagement, and opportunities to develop and utilize adaptive strategies to maximize functional performance in preparation for safe transition and integration back into school, work, and the community. Plan to address topic of tbd in next OT group session.                  OT Education - 09/05/21 1352     Education Details The Power of Routines 2/2              OT Short Term Goals - 08/21/21 1850       OT SHORT TERM GOAL #1   Title Pt will demonstrate independence in psychosocial skill areas in order to successfully engage in community reentry upon DC    Time 4    Period Weeks    Status New    Target Date 09/20/21      OT SHORT TERM GOAL #2   Title Pt will demonstrate improved coping skills in order to manage iADLs and [place] at time of discharge      OT SHORT TERM GOAL #3   Title pt to demonstrate independence w/ time mgmt and goal setting for iADLs at time of discharge from this [facility] program.                      Plan - 09/05/21  1352     Psychosocial Skills Coping Strategies;Habits;Interpersonal Interaction;Routines and Behaviors             Patient will benefit from skilled therapeutic intervention in order to improve the following deficits and impairments:       Psychosocial Skills: Coping Strategies, Habits, Interpersonal Interaction, Routines and Behaviors   Visit Diagnosis: Difficulty coping    Problem List Patient Active Problem List   Diagnosis Date Noted   Anxiety disorder, unspecified 08/06/2021   Depression 08/06/2021   MDD (major depressive disorder), severe (HCC) 08/06/2021   Constipation 01/05/2021   TOA (tubo-ovarian abscess) 01/05/2021   Sepsis (HCC) 01/01/2021   Endometriosis 05/06/2020   Hypothyroid 04/22/2020   Hypothyroidism, acquired, 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   Acanthosis 12/16/2014   Pediatric overweight 12/16/2014  Ted Mcalpine, OT 09/05/2021, 1:53 PM  Kerrin Champagne, OT   [facility] PROGRAM [address] [place], [place], [address] Phone: [phone]   Fax:  [phone]  Name: Rebecca Vaughan MRN: [id number] Date of Birth: [birth year]

## 2021-09-06 ENCOUNTER — Other Ambulatory Visit (HOSPITAL_COMMUNITY): Payer: 59

## 2021-09-06 ENCOUNTER — Other Ambulatory Visit (HOSPITAL_COMMUNITY): Payer: 59 | Admitting: Professional

## 2021-09-06 ENCOUNTER — Encounter (HOSPITAL_COMMUNITY): Payer: Self-pay | Admitting: Family

## 2021-09-06 ENCOUNTER — Encounter (HOSPITAL_COMMUNITY): Payer: Self-pay

## 2021-09-06 DIAGNOSIS — R4589 Other symptoms and signs involving emotional state: Secondary | ICD-10-CM

## 2021-09-06 DIAGNOSIS — F322 Major depressive disorder, single episode, severe without psychotic features: Secondary | ICD-10-CM

## 2021-09-06 MED ORDER — HYDROXYZINE HCL 25 MG PO TABS: 25.0000 mg | ORAL_TABLET | Freq: Three times a day (TID) | ORAL | 0 refills | Status: AC | PRN

## 2021-09-06 MED ORDER — VENLAFAXINE HCL ER 75 MG PO CP24: 75.0000 mg | ORAL_CAPSULE | Freq: Every day | ORAL | 0 refills | Status: AC

## 2021-09-06 MED ORDER — TOPIRAMATE 25 MG PO TABS
25.0000 mg | ORAL_TABLET | Freq: Two times a day (BID) | ORAL | 0 refills | Status: AC
Start: 2021-09-06 — End: 2021-10-06

## 2021-09-06 NOTE — Therapy (Signed)
[facility] PARTIAL HOSPITALIZATION PROGRAM [address] [place], [place], [address] Phone: [phone]   Fax:  [phone]  Occupational Therapy Treatment Virtual Visit via Video Note  I connected with Rebecca Vaughan on 09/06/21 at  8:00 AM EDT by a video enabled telemedicine application and verified that I am speaking with the correct person using two identifiers.  Location: Patient: home Provider: office   I discussed the limitations of evaluation and management by telemedicine and the availability of in person appointments. The patient expressed understanding and agreed to proceed.    The patient was advised to call back or seek an in-person evaluation if the symptoms worsen or if the condition fails to improve as anticipated.  I provided 50 minutes of non-face-to-face time during this encounter.   Patient Details  Name: Rebecca Vaughan MRN: [id number] Date of Birth: [birth year] No data recorded  Encounter Date: 09/06/2021   OT End of Session - 09/06/21 1401     Visit Number 10    Number of Visits 20    Date for OT Re-Evaluation 09/20/21    Authorization Type AETNA AETNA NAP  [id number] [place]    OT Start Time 1200    OT Stop Time 1250    OT Time Calculation (min) 50 min             Past Medical History:  Diagnosis Date   Anxiety    Constipation    COVID 10/2019   fever chills headache x 5 days all symptoms resolved   Depression    Endometriosis    Hypothyroidism    Pneumonia as child age 50   right lung   Thyroid disease    hyperthyroid   Wears glasses     Past Surgical History:  Procedure Laterality Date   LAPAROTOMY N/A 05/06/2020   Procedure: EXPLORATORY LAPAROTOMY, LYSIS OF ADHESIONS, DRAINAGE OF ENDOMETRIOMAS, PARTIAL RIGHT OVARIAN CYSTECTOMY;  Surgeon: Marcelle Overlie, MD;  Location: [facility];  Service: Gynecology;  Laterality: N/A;    There were no vitals filed  for this visit.   Subjective Assessment - 09/06/21 1401     Currently in Pain? No/denies    Pain Score 0-No pain                Group Session:  S: "Doing good today, feeling better overall"  O: The therapy session was led by an occupational therapist via telehealth with five patients in attendance. The focus of the session was on exploring the power of reciprocity and fairness to improve relationships and overall mental health and wellbeing. At the beginning of the session, the OT provided an introduction to the topic and defined the session's objectives. The participants then engaged in a discussion about their past experiences with the concept of reciprocity and how these experiences have influenced their lives and relationships, both positively and negatively. Throughout the session, the OT emphasized the importance of reciprocal relationships and how this concept can serve as a guide for future engagements with relationships and relationship development.  A: The patient actively participated in the therapy session on the power of reciprocity and fairness to improve relationships and overall mental health and wellbeing. They shared their past experiences with the concept of reciprocity and demonstrated an understanding of its importance in relationships. The patient engaged in meaningful discussion with the other participants and appeared to grasp the objectives of the session. The occupational therapist was able to  provide guidance and support as needed, and the patient demonstrated a willingness to apply what was discussed to their own life and relationships.  P: Continue to attend PHP OT group sessions 5x week for 4 weeks to promote daily structure, social engagement, and opportunities to develop and utilize adaptive strategies to maximize functional performance in preparation for safe transition and integration back into school, work, and the community. Plan to address topic of The  Power of Reciprocity 2/2 in next OT group session.                   OT Education - 09/06/21 1401     Education Details The Power of Reciprocity 1/2              OT Short Term Goals - 08/21/21 1850       OT SHORT TERM GOAL #1   Title Pt will demonstrate independence in psychosocial skill areas in order to successfully engage in community reentry upon DC    Time 4    Period Weeks    Status New    Target Date 09/20/21      OT SHORT TERM GOAL #2   Title Pt will demonstrate improved coping skills in order to manage iADLs and [place] at time of discharge      OT SHORT TERM GOAL #3   Title pt to demonstrate independence w/ time mgmt and goal setting for iADLs at time of discharge from this [facility] program.                      Plan - 09/06/21 1402     Psychosocial Skills Coping Strategies;Habits;Interpersonal Interaction;Routines and Behaviors             Patient will benefit from skilled therapeutic intervention in order to improve the following deficits and impairments:       Psychosocial Skills: Coping Strategies, Habits, Interpersonal Interaction, Routines and Behaviors   Visit Diagnosis: Difficulty coping    Problem List Patient Active Problem List   Diagnosis Date Noted   Anxiety disorder, unspecified 08/06/2021   Depression 08/06/2021   MDD (major depressive disorder), severe (HCC) 08/06/2021   Constipation 01/05/2021   TOA (tubo-ovarian abscess) 01/05/2021   Sepsis (HCC) 01/01/2021   Endometriosis 05/06/2020   Hypothyroid 04/22/2020   Hypothyroidism, acquired, 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   Acanthosis 12/16/2014   Pediatric overweight 12/16/2014    Ted Mcalpine, OT 09/06/2021, 2:02 PM  Kerrin Champagne, OT   [facility] HOSPITALIZATION PROGRAM [address] [place], [place], [address] Phone: [phone]   Fax:  [phone]  Name: Rebecca Vaughan MRN: [id number] Date of Birth: 11-Jul-2000

## 2021-09-06 NOTE — Progress Notes (Signed)
Virtual Visit via Video Note  I connected with Rebecca Vaughan on 09/06/21 at  9:00 AM EDT by a video enabled telemedicine application and verified that I am speaking with the correct person using two identifiers.  Location: Patient: Home Provider: Office   I discussed the limitations of evaluation and management by telemedicine and the availability of in person appointments. The patient expressed understanding and agreed to proceed.   I discussed the assessment and treatment plan with the patient. The patient was provided an opportunity to ask questions and all were answered. The patient agreed with the plan and demonstrated an understanding of the instructions.   The patient was advised to call back or seek an in-person evaluation if the symptoms worsen or if the condition fails to improve as anticipated.  I provided 15 minutes of non-face-to-face time during this encounter.   Oneta Rack, NP    Partial Hospitalization Outpatient Program Discharge Summary  Rebecca Vaughan [id number]  Admission date: 08/21/2021 Discharge date: 09/08/2021  Reason for admission: "Rebecca Vaughan is a 21 y.o. African American female presents after a recent inpatient admission due to a suicide attempted to overdose.  She reports she is currently followed by Merlinda Frederick at [facility] psychiatric where she is currently prescribed Effexor, hydroxyzine and Topamax for mood stabilization.  States she resides with her mother and has plans to attend school to study cardiology at [employer]  Denied previous inpatient admissions.  Does admit to using marijuana occasionally."  Progress in Program Toward Treatment Goals: Progressing -Janiel attended with minimal participation during daily group session.  She reports ongoing depression denying suicidal or homicidal ideations.  Denies auditory visual hallucinations.  States she was prescribed vitamin D by her primary care provider and is seeking a medication  refill.  Education provided with following up with primary care provider.  Patient was receptive to plan.  Denied that she has follow-up services with psychiatry and/or therapy.  Progress (rationale): Patient to consider stepping down to intensive outpatient programming at discharge.  We will make Effexor and Topamax available.  Collaboration of Care: Medication Management AEB  Effexor 75 mg  Topamax 25 mg  and Primary Care Provider AEB Primary Care  Patient/Guardian was advised Release of Information must be obtained prior to any record release in order to collaborate their care with an outside provider. Patient/Guardian was advised if they have not already done so to contact the registration department to sign all necessary forms in order for [place] to release information regarding their care.   Consent: Patient/Guardian gives verbal consent for treatment and assignment of benefits for services provided during this visit. Patient/Guardian expressed understanding and agreed to proceed.   Oneta Rack, NP 09/06/2021

## 2021-09-07 ENCOUNTER — Ambulatory Visit (HOSPITAL_COMMUNITY): Payer: 59

## 2021-09-07 ENCOUNTER — Other Ambulatory Visit (HOSPITAL_COMMUNITY): Payer: 59

## 2021-09-08 ENCOUNTER — Telehealth (HOSPITAL_COMMUNITY): Payer: Self-pay | Admitting: Professional

## 2021-09-08 ENCOUNTER — Other Ambulatory Visit (HOSPITAL_COMMUNITY): Payer: 59

## 2021-09-08 ENCOUNTER — Ambulatory Visit (HOSPITAL_COMMUNITY): Payer: 59

## 2021-09-11 ENCOUNTER — Encounter (HOSPITAL_COMMUNITY): Payer: Self-pay

## 2021-09-11 ENCOUNTER — Other Ambulatory Visit (HOSPITAL_COMMUNITY): Payer: 59 | Admitting: Licensed Clinical Social Worker

## 2021-09-11 ENCOUNTER — Other Ambulatory Visit (HOSPITAL_COMMUNITY): Payer: 59

## 2021-09-11 DIAGNOSIS — F322 Major depressive disorder, single episode, severe without psychotic features: Secondary | ICD-10-CM | POA: Diagnosis not present

## 2021-09-11 DIAGNOSIS — R4589 Other symptoms and signs involving emotional state: Secondary | ICD-10-CM

## 2021-09-12 ENCOUNTER — Other Ambulatory Visit (HOSPITAL_COMMUNITY): Payer: 59

## 2021-09-12 ENCOUNTER — Ambulatory Visit (HOSPITAL_COMMUNITY): Payer: 59

## 2021-09-13 ENCOUNTER — Other Ambulatory Visit (HOSPITAL_COMMUNITY): Payer: 59

## 2021-09-13 ENCOUNTER — Ambulatory Visit (HOSPITAL_COMMUNITY): Payer: 59

## 2021-09-14 NOTE — Psych (Signed)
Virtual Visit via Video Note  I connected with Rebecca Vaughan on 09/06/21 at  9:00 AM EDT by a video enabled telemedicine application and verified that I am speaking with the correct person using two identifiers.  Location: Patient: Home Provider: Clinical Home Office   I discussed the limitations of evaluation and management by telemedicine and the availability of in person appointments. The patient expressed understanding and agreed to proceed.  Follow Up Instructions:    I discussed the assessment and treatment plan with the patient. The patient was provided an opportunity to ask questions and all were answered. The patient agreed with the plan and demonstrated an understanding of the instructions.   The patient was advised to call back or seek an in-person evaluation if the symptoms worsen or if the condition fails to improve as anticipated.  I provided 240 minutes of non-face-to-face time during this encounter.   Rebecca Vaughan, [facility]   [facility] BH PHP THERAPIST PROGRESS NOTE  Rebecca Vaughan [id number]  Session Time: 9:00-10:00  Participation Level: Active  Behavioral Response: CasualAlertDepressed  Type of Therapy: Group Therapy  Treatment Goals addressed: Coping  Progress Towards Goals: Progressing  Interventions: CBT, DBT, Solution Focused, Strength-based, Supportive, and Reframing  Summary: Clinician led check-in regarding current stressors and situation, and review of patient completed daily inventory. Clinician utilized active listening and empathetic response and validated patient emotions. Clinician facilitated processing group on pertinent issues.   Therapist Response: Rebecca Vaughan is a 21 y.o. female who presents with depression and anxiety symptoms. Patient arrived within time allowed. Patient rates her mood at a 4 on a scale of 1-10 with 10 being best. Pt reports she is "a little nauseous." Pt reports her mom talked to her dad for the first time in a  long time yesterday. Pt reports Dad is in the hospital. Pt reports she is struggling with Mom talking to Dad and feels like Dad didn't care about pt when she was hospitalized for medical reasons. Pt able to process. ?Pt engaged in discussion.?        Session Time: 10:00 am - 11:00 am   Participation Level: Active  Behavioral Response: CasualAlertAnxious and Depressed   Type of Therapy: Group Therapy   Treatment Goals addressed: Coping   Progress Towards Goals: Progressing   Interventions: CBT, DBT, Solution Focused, Strength-based, Supportive, and Reframing   Therapist Response: Clinician led group on anger.   Summary: Pt reports anger about Mom talking to Dad again because Dad is in the hospital.       Session Time: 11:00 am - 12:00 pm   Participation Level: Active   Behavioral Response: CasualAlertAnxious and Depressed   Type of Therapy: Group Therapy   Treatment Goals addressed: Coping   Progress Towards Goals: Progressing  Interventions: Skills training, Supportive   Summary: Rebecca Vaughan, talked with group about community.   Therapist Response: Pt participated in group and discussed what community means to self.     Session Time: 12:00 pm - 1:00 pm   Participation Level: Active   Behavioral Response: CasualAlertAnxious and Depressed   Type of Therapy: Group Therapy   Treatment Goals addressed: Coping   Progress Towards Goals: Progressing   Interventions: Skills training, Supportive   Therapist Response: 12:00 - 12:50 pm: OT group 12:50 - 1:00 pm: Clinician led check-out. Clinician assessed for immediate needs, medication compliance and efficacy, and safety concerns?   Summary: 12:00 - 12:50 pm: see OT note 12:50 - 1:00: Patient demonstrates  some progress as evidenced by increased participation in group. Patient denies SI/HI/self-harm at the end of group.   Suicidal/Homicidal: Nowithout intent/plan   Plan: ?Pt will continue in PHP and  medication management while continuing to work on decreasing depression symptoms,?SI, and anxiety symptoms,?and increasing the ability to self manage symptoms.   Suicidal/Homicidal: Nowithout intent/plan  Plan: Pt will continue in PHP and medication management while continuing to work on decreasing depression symptoms and increasing the ability to self manage symptoms.   Collaboration of Care: Medication Management AEB Hillery Jacks  Patient/Guardian was advised Release of Information must be obtained prior to any record release in order to collaborate their care with an outside provider. Patient/Guardian was advised if they have not already done so to contact the registration department to sign all necessary forms in order for [place] to release information regarding their care.   Consent: Patient/Guardian gives verbal consent for treatment and assignment of benefits for services provided during this visit. Patient/Guardian expressed understanding and agreed to proceed.   Diagnosis: MDD (major depressive disorder), severe (HCC) [F32.2]    1. MDD (major depressive disorder), severe (HCC)   2. Difficulty coping       Rebecca Vaughan, [facility] 09/06/21

## 2021-09-14 NOTE — Psych (Signed)
Virtual Visit via Video Note  I connected with Rebecca Vaughan on 09/05/21 at  9:00 AM EDT by a video enabled telemedicine application and verified that I am speaking with the correct person using two identifiers.  Location: Patient: Home Provider: Clinical Home Office   I discussed the limitations of evaluation and management by telemedicine and the availability of in person appointments. The patient expressed understanding and agreed to proceed.  Follow Up Instructions:    I discussed the assessment and treatment plan with the patient. The patient was provided an opportunity to ask questions and all were answered. The patient agreed with the plan and demonstrated an understanding of the instructions.   The patient was advised to call back or seek an in-person evaluation if the symptoms worsen or if the condition fails to improve as anticipated.  I provided 240 minutes of non-face-to-face time during this encounter.   Rebecca Vaughan, [facility]   [facility] PHP THERAPIST PROGRESS NOTE  Rebecca Vaughan [id number]  Session Time: 9:00-10:00  Participation Level: Minimal  Behavioral Response: CasualAlertDepressed  Type of Therapy: Group Therapy  Treatment Goals addressed: Coping  Progress Towards Goals: Progressing  Interventions: CBT, DBT, Solution Focused, Strength-based, Supportive, and Reframing  Summary: Clinician led check-in regarding current stressors and situation, and review of patient completed daily inventory. Clinician utilized active listening and empathetic response and validated patient emotions. Clinician facilitated processing group on pertinent issues.   Therapist Response: Rebecca Vaughan is a 21 y.o. female who presents with depression and anxiety symptoms. Patient arrived within time allowed. Patient rates her mood at a 7 on a scale of 1-10 with 10 being best. Pt reports she is "good." Pt reports she stayed inside yesterday. Pt reports nothing to work on for  the day. ?Pt engaged in discussion.?        Session Time: 10:00 am - 11:00 am   Participation Level: Minimal  Behavioral Response: CasualAlertAnxious and Depressed   Type of Therapy: Group Therapy   Treatment Goals addressed: Coping   Progress Towards Goals: Progressing   Interventions: CBT, DBT, Solution Focused, Strength-based, Supportive, and Reframing   Therapist Response: Clinician led group on grounding techniques.   Summary: Pt reports using categories game or opposite fingers could help with grounding for her.       Session Time: 11:00 am - 12:00 pm   Participation Level: Active   Behavioral Response: CasualAlertAnxious and Depressed   Type of Therapy: Group Therapy   Treatment Goals addressed: Coping   Progress Towards Goals: Progressing  Interventions: Skills training   Summary: See OT note   Therapist Response: See OT note.     Session Time: 12:00 pm - 1:00 pm   Participation Level: Minimal   Behavioral Response: CasualAlertAnxious and Depressed   Type of Therapy: Group Therapy   Treatment Goals addressed: Coping   Progress Towards Goals: Progressing   Interventions: CBT, DBT, Solution Focused, Strength-based, Supportive, and Reframing   Therapist Response: 12:00 - 12:50 pm: Clinician continued topic of "Positive Psychology". Group discussed the 5 activities to use to help change their lens. Pt's identified which activity they will try.  12:50 - 1:00 pm: Clinician led check-out. Clinician assessed for immediate needs, medication compliance and efficacy, and safety concerns?   Summary: 12:00 - 12:50 pm: Pt reports she will use meditation to help change her lens. 12:50 - 1:00: At check-out, patient rates her mood at a 7 on a scale of 1-10 with 10 being great. Pt  reports she is going to take a nap and then get out of the house to do "something."  Patient demonstrates some progress as evidenced by identifying a new coping skill to use. Patient  denies SI/HI/self-harm at the end of group.   Suicidal/Homicidal: Nowithout intent/plan   Plan: ?Pt will continue in PHP and medication management while continuing to work on decreasing depression symptoms,?SI, and anxiety symptoms,?and increasing the ability to self manage symptoms.   Suicidal/Homicidal: Nowithout intent/plan  Plan: Pt will continue in PHP and medication management while continuing to work on decreasing depression symptoms and increasing the ability to self manage symptoms.   Collaboration of Care: Medication Management AEB Hillery Jacks  Patient/Guardian was advised Release of Information must be obtained prior to any record release in order to collaborate their care with an outside provider. Patient/Guardian was advised if they have not already done so to contact the registration department to sign all necessary forms in order for [place] to release information regarding their care.   Consent: Patient/Guardian gives verbal consent for treatment and assignment of benefits for services provided during this visit. Patient/Guardian expressed understanding and agreed to proceed.   Diagnosis: MDD (major depressive disorder), severe (HCC) [F32.2]    1. MDD (major depressive disorder), severe (HCC)       Rebecca Vaughan, [facility] 09/05/21

## 2021-12-15 NOTE — Psych (Signed)
Virtual Visit via Video Note  I connected with Rebecca Vaughan on 08/21/21 at  9:00 AM EDT by a video enabled telemedicine application and verified that I am speaking with the correct person using two identifiers.  Location: Patient: patient home Provider: clinical home office   I discussed the limitations of evaluation and management by telemedicine and the availability of in person appointments. The patient expressed understanding and agreed to proceed.  I discussed the assessment and treatment plan with the patient. The patient was provided an opportunity to ask questions and all were answered. The patient agreed with the plan and demonstrated an understanding of the instructions.   The patient was advised to call back or seek an in-person evaluation if the symptoms worsen or if the condition fails to improve as anticipated.  Pt was provided 240 minutes of non-face-to-face time during this encounter.   Rebecca Glass, LCSW   [facility] PHP THERAPIST PROGRESS NOTE  Rebecca Vaughan [id number]  Session Time: 9:00 - 10:00  Participation Level: Active  Behavioral Response: CasualAlertDepressed  Type of Therapy: Group Therapy  Treatment Goals addressed: Coping  Progress Towards Goals: Initial  Interventions: CBT, DBT, Supportive, and Reframing  Summary: Clinician led check-in regarding current stressors and situation, and review of patient completed daily inventory. Clinician utilized active listening and empathetic response and validated patient emotions. Clinician facilitated processing group on pertinent issues.?    Summary: Rebecca Vaughan is a 21 y.o. female who presents with depression symptoms. Patient arrived within time allowed. Patient rates her mood at a 8 on a scale of 1-10 with 10 being best. Pt states she feels "pretty good." Pt reports she spent the weekend with a friend which has improved her mood. Pt reports feeling she could leave the house today, which "is a big  step." Pt states she slept approximately 6 hours and has poor appetite.  Patient able to process. Patient engaged in discussion.          Session Time: 10:00 am - 11:00 am   Participation Level: Active   Behavioral Response: CasualAlertDepressed   Type of Therapy: Group Therapy   Treatment Goals addressed: Coping   Progress Towards Goals: Progressing   Interventions: CBT, DBT, Solution Focused, Strength-based, Supportive, and Reframing   Therapist Response: Cln led discussion on rumination: and how it can affect [place] negatively. Group members share topics, situations, and times in which rumination is most problem problematic for them. Cln encouraged pt's to consider DBT distraction and STOP skills or CBT thought challenging to manage rumination.    Therapist Response:  Pt engaged in discussion.           Session Time: 11:00 -12:00   Participation Level: Active   Behavioral Response: CasualAlertDepressed   Type of Therapy: Group Therapy, Occupational Therapy   Treatment Goals addressed: Coping   Progress Towards Goals: Progressing   Interventions: Supportive, Education   Summary:  Occupational Therapy group led by cln E. Hollan.   Therapist Response: See OT note         Session Time: 12:00 -1:00   Participation Level: Active   Behavioral Response: CasualAlertDepressed   Type of Therapy: Group therapy   Treatment Goals addressed: Coping   Progress Towards Goals: Progressing   Interventions: CBT; Solution focused; Supportive; Reframing   Summary: 12:00 - 12:50: Cln continued topic of DBT distress tolerance skills. Cln introduced TIPP skills to use during cases of extreme emotion. Group practiced deep breathing and progressive muscle relaxation together.  Group discussed which situations they can apply TIPP skills.  12:50 -1:00 Clinician led check-out. Clinician assessed for immediate needs, medication compliance and efficacy, and safety concerns.   Therapist  Response: 12:00 - 12:50: Pt engaged in discussion and practiced with group.  12:50 - 1:00 pm: At check-out, patient reports no immediate concerns. Patient demonstrates progress as evidenced by participating in first group session. Patient denies SI/HI/self-harm thoughts at the end of group.   Suicidal/Homicidal: Nowithout intent/plan  Plan: Pt will continue in PHP while working to decrease depression symptoms, increase emotion regulation, and increase ability to manage symptoms in a healthy manner.   Collaboration of Care: Medication Management AEB T [place]  Patient/Guardian was advised Release of Information must be obtained prior to any record release in order to collaborate their care with an outside provider. Patient/Guardian was advised if they have not already done so to contact the registration department to sign all necessary forms in order for [place] to release information regarding their care.   Consent: Patient/Guardian gives verbal consent for treatment and assignment of benefits for services provided during this visit. Patient/Guardian expressed understanding and agreed to proceed.   Diagnosis: MDD (major depressive disorder), severe ([place]) [F32.2]    1. MDD (major depressive disorder), severe ([place])       Rebecca Glass, LCSW 12/15/2021

## 2021-12-15 NOTE — Psych (Signed)
Virtual Visit via Video Note  I connected with [name] on 08/22/21 at  9:00 AM EDT by a video enabled telemedicine application and verified that I am speaking with the correct person using two identifiers.  Location: Patient: patient home Provider: clinical home office   I discussed the limitations of evaluation and management by telemedicine and the availability of in person appointments. The patient expressed understanding and agreed to proceed.  I discussed the assessment and treatment plan with the patient. The patient was provided an opportunity to ask questions and all were answered. The patient agreed with the plan and demonstrated an understanding of the instructions.   The patient was advised to call back or seek an in-person evaluation if the symptoms worsen or if the condition fails to improve as anticipated.  Pt was provided 240 minutes of non-face-to-face time during this encounter.   Lorin Glass, LCSW   [facility] PHP THERAPIST PROGRESS NOTE  Rebecca Vaughan [id number]  Session Time: 9:00 - 10:00  Participation Level: Active  Behavioral Response: CasualAlertDepressed  Type of Therapy: Group Therapy  Treatment Goals addressed: Coping  Progress Towards Goals: Initial  Interventions: CBT, DBT, Supportive, and Reframing  Summary: Clinician led check-in regarding current stressors and situation, and review of patient completed daily inventory. Clinician utilized active listening and empathetic response and validated patient emotions. Clinician facilitated processing group on pertinent issues.?    Summary: Rebecca Vaughan is a 21 y.o. female who presents with depression symptoms. Patient arrived within time allowed. Patient rates her mood at a 6 on a scale of 1-10 with 10 being best. Pt states she feels "draggy." Pt reports feeling anxious while waiting to hear back from her school. Pt reports feeling queasy due to the anxiety and having trouble keeping food  down. Pt reports the nausea impacted her sleep as well.  Patient able to process. Patient engaged in discussion.          Session Time: 10:00 am - 11:00 am   Participation Level: Active   Behavioral Response: CasualAlertDepressed   Type of Therapy: Group Therapy   Treatment Goals addressed: Coping   Progress Towards Goals: Progressing   Interventions: CBT, DBT, Solution Focused, Strength-based, Supportive, and Reframing   Therapist Response: Cln led discussion on impulsivity. Group discussed struggles with impulsivity. Cln highlighted theme of immediacy. Group built insight around the way immediacy interacts with impulsivity. Cln encouraged pt's to consider mantras and grounding statements to remind themselves there is time to think/feel/act.    Therapist Response:  Pt engaged in discussion and is able to make connections and gain insight.            Session Time: 11:00 -12:00   Participation Level: Active   Behavioral Response: CasualAlertDepressed   Type of Therapy: Group Therapy, Occupational Therapy   Treatment Goals addressed: Coping   Progress Towards Goals: Progressing   Interventions: Supportive, Education   Summary:  Occupational Therapy group led by cln E. Hollan.   Therapist Response: Pt participated         Session Time: 12:00 -1:00   Participation Level: Active   Behavioral Response: CasualAlertDepressed   Type of Therapy: Group therapy   Treatment Goals addressed: Coping   Progress Towards Goals: Progressing   Interventions: CBT; Solution focused; Supportive; Reframing   Summary: 12:00 - 12:50: Cln led discussion on healthy relationships. Group members shared issues they have experienced in past relationships and in identifying what "healthy" looks like. Cln discussed respect, trust,  and honesty as non-negotiable traits in a healthy dynamic. Group shared and problem solved barriers to recognizing and priortizing these traits.  12:50 -1:00  Clinician led check-out. Clinician assessed for immediate needs, medication compliance and efficacy, and safety concerns.   Therapist Response: 12:00 - 12:50: Pt engaged in discussion and is able to process.   12:50 - 1:00 pm: At check-out, patient reports no immediate concerns. Patient demonstrates progress as evidenced by attempting coping skills. Patient denies SI/HI/self-harm thoughts at the end of group.   Suicidal/Homicidal: Nowithout intent/plan  Plan: Pt will continue in PHP while working to decrease depression symptoms, increase emotion regulation, and increase ability to manage symptoms in a healthy manner.   Collaboration of Care: Medication Management AEB T Carattini  Patient/Guardian was advised Release of Information must be obtained prior to any record release in order to collaborate their care with an outside provider. Patient/Guardian was advised if they have not already done so to contact the registration department to sign all necessary forms in order for [place] to release information regarding their care.   Consent: Patient/Guardian gives verbal consent for treatment and assignment of benefits for services provided during this visit. Patient/Guardian expressed understanding and agreed to proceed.   Diagnosis: MDD (major depressive disorder), severe (HCC) [F32.2]    1. MDD (major depressive disorder), severe (HCC)       Donia Guiles, LCSW

## 2021-12-16 NOTE — Psych (Signed)
Virtual Visit via Video Note  I connected with Rebecca Vaughan on 09/01/21 at  9:00 AM EDT by a video enabled telemedicine application and verified that I am speaking with the correct person using two identifiers.  Location: Patient: patient home Provider: clinical home office   I discussed the limitations of evaluation and management by telemedicine and the availability of in person appointments. The patient expressed understanding and agreed to proceed.  I discussed the assessment and treatment plan with the patient. The patient was provided an opportunity to ask questions and all were answered. The patient agreed with the plan and demonstrated an understanding of the instructions.   The patient was advised to call back or seek an in-person evaluation if the symptoms worsen or if the condition fails to improve as anticipated.  Pt was provided 240 minutes of non-face-to-face time during this encounter.   Lorin Glass, LCSW   [facility] PHP THERAPIST PROGRESS NOTE  Rebecca Vaughan [id number]  Session Time: 9:00 - 10:00  Participation Level: Active  Behavioral Response: CasualAlertDepressed  Type of Therapy: Group Therapy  Treatment Goals addressed: Coping  Progress Towards Goals: Progressing  Interventions: CBT, DBT, Supportive, and Reframing  Summary: Clinician led check-in regarding current stressors and situation, and review of patient completed daily inventory. Clinician utilized active listening and empathetic response and validated patient emotions. Clinician facilitated processing group on pertinent issues.?    Summary: Rebecca Vaughan is a 21 y.o. female who presents with depression symptoms. Patient arrived within time allowed. Patient rates her mood at a 8 on a scale of 1-10 with 10 being best. Pt states she feels "good." Pt reports is trying to be intentional in setting good habits. Pt reports leaving the house, seeing a friend, and exercising yesterday with hope  to do it again today. Pt states sleeping 7 hours. Pt reports concern with consistency.  Patient able to process. Patient engaged in discussion.          Session Time: 10:00 am - 11:00 am   Participation Level: Active   Behavioral Response: CasualAlertDepressed   Type of Therapy: Group Therapy   Treatment Goals addressed: Coping   Progress Towards Goals: Progressing   Interventions: CBT, DBT, Solution Focused, Strength-based, Supportive, and Reframing   Therapist Response:  Cln continued topic of positive self-esteem. Group members shared struggles with appearance and how that impacts their self-esteem. Cln encouraged pt's to consider what their body does for them as a way to reframe physical self-esteem.     Therapist Response: Pt engaged in discussion and shares struggles. Pt is able to state something her body does for her.           Session Time: 11:00 -12:00   Participation Level: Active   Behavioral Response: CasualAlertDepressed   Type of Therapy: Group Therapy, Occupational Therapy   Treatment Goals addressed: Coping   Progress Towards Goals: Progressing   Interventions: Supportive, Education   Summary:  Occupational Therapy group led by cln E. Hollan.   Therapist Response: See OT note       Session Time: 12:00 -1:00   Participation Level: Active   Behavioral Response: CasualAlertDepressed   Type of Therapy: Group therapy   Treatment Goals addressed: Coping   Progress Towards Goals: Progressing   Interventions: CBT; Solution focused; Supportive; Reframing   Summary: 12:00 - 12:50: Cln led discussion on valuing ourselves in the same way, or more than we value others. Group viewed TED talk "The Person You Really Need  to Rebecca Vaughan" to facilitate discussion. Group discussed what it would be like to view themselves as they do a romantic partner and treat themselves with as much care.  12:50 -1:00 Clinician led check-out. Clinician assessed for immediate  needs, medication compliance and efficacy, and safety concerns.   Therapist Response: 12:00 - 12:50:  Pt engaged in discussion and is able to share struggles with valuing themself. 12:50 - 1:00 pm: At check-out, patient reports no immediate concerns. Patient demonstrates little progress as evidenced by increase activity level. Patient denies SI/HI/self-harm thoughts at the end of group.   Suicidal/Homicidal: Nowithout intent/plan  Plan: Pt will continue in PHP while working to decrease depression symptoms, increase emotion regulation, and increase ability to manage symptoms in a healthy manner.   Collaboration of Care: Medication Management AEB T [place]  Patient/Guardian was advised Release of Information must be obtained prior to any record release in order to collaborate their care with an outside provider. Patient/Guardian was advised if they have not already done so to contact the registration department to sign all necessary forms in order for [place] to release information regarding their care.   Consent: Patient/Guardian gives verbal consent for treatment and assignment of benefits for services provided during this visit. Patient/Guardian expressed understanding and agreed to proceed.   Diagnosis: MDD (major depressive disorder), severe (Rebecca Vaughan) [F32.2]    1. MDD (major depressive disorder), severe (Rebecca Vaughan)       Lorin Glass, LCSW

## 2021-12-16 NOTE — Psych (Signed)
Virtual Visit via Video Note  I connected with Rebecca Vaughan on 08/29/21 at  9:00 AM EDT by a video enabled telemedicine application and verified that I am speaking with the correct person using two identifiers.  Location: Patient: patient home Provider: clinical home office   I discussed the limitations of evaluation and management by telemedicine and the availability of in person appointments. The patient expressed understanding and agreed to proceed.  I discussed the assessment and treatment plan with the patient. The patient was provided an opportunity to ask questions and all were answered. The patient agreed with the plan and demonstrated an understanding of the instructions.   The patient was advised to call back or seek an in-person evaluation if the symptoms worsen or if the condition fails to improve as anticipated.  Pt was provided 240 minutes of non-face-to-face time during this encounter.   Lorin Glass, LCSW   [facility] PHP THERAPIST PROGRESS NOTE  Rebecca Vaughan [id number]  Session Time: 9:00 - 10:00  Participation Level: Active  Behavioral Response: CasualAlertDepressed  Type of Therapy: Group Therapy  Treatment Goals addressed: Coping  Progress Towards Goals: Progressing  Interventions: CBT, DBT, Supportive, and Reframing  Summary: Clinician led check-in regarding current stressors and situation, and review of patient completed daily inventory. Clinician utilized active listening and empathetic response and validated patient emotions. Clinician facilitated processing group on pertinent issues.?    Summary: Rebecca Vaughan is a 21 y.o. female who presents with depression symptoms. Patient arrived within time allowed. Patient rates her mood at a 3 on a scale of 1-10 with 10 being best. Pt states she feels "not good." Pt reports "she began her period and is in pain related to endometriosis. Pt reports it is negatively impacting mood, sleep, and energy.  Patient able to process. Patient engaged in discussion.          Session Time: 10:00 am - 11:00 am   Participation Level: Active   Behavioral Response: CasualAlertDepressed   Type of Therapy: Group Therapy   Treatment Goals addressed: Coping   Progress Towards Goals: Progressing   Interventions: CBT, DBT, Solution Focused, Strength-based, Supportive, and Reframing   Therapist Response: Cln led discussion on unrealistic expectations and the ways expectations can alter perspective. Group members discussed feelings and situations that have occurred due to expectation and how to know if they are unrealistic. Cln brought in CBT thought challenging and reframing to aid discussion.     Therapist Response: Pt engaged in discussion and reports gaining insight.          Session Time: 11:00 -12:00   Participation Level: Active   Behavioral Response: CasualAlertDepressed   Type of Therapy: Group Therapy, Occupational Therapy   Treatment Goals addressed: Coping   Progress Towards Goals: Progressing   Interventions: Supportive, Education   Summary:  Occupational Therapy group led by cln E. Hollan.   Therapist Response: See OT note       Session Time: 12:00 -1:00   Participation Level: Active   Behavioral Response: CasualAlertDepressed   Type of Therapy: Group therapy   Treatment Goals addressed: Coping   Progress Towards Goals: Progressing   Interventions: CBT; Solution focused; Supportive; Reframing   Summary: 12:00 - 12:50: Cln continued topic of CBT cognitive distortions and utilized handout "Unhealthy Thought Patterns "to review common examples of distorted thought to increase awareness of the distorted thoughts. 12:50 -1:00 Clinician led check-out. Clinician assessed for immediate needs, medication compliance and efficacy, and safety concerns.  Therapist Response: 12:00 - 12:50: Pt engaged in discussion and is able to make connections from her life to the  distorted thoughts.   12:50 - 1:00 pm: At check-out, patient reports no immediate concerns. Patient demonstrates little progress as evidenced by increased positive dialogue. Patient denies SI/HI/self-harm thoughts at the end of group.   Suicidal/Homicidal: Nowithout intent/plan  Plan: Pt will continue in PHP while working to decrease depression symptoms, increase emotion regulation, and increase ability to manage symptoms in a healthy manner.   Collaboration of Care: Medication Management AEB T Koehn  Patient/Guardian was advised Release of Information must be obtained prior to any record release in order to collaborate their care with an outside provider. Patient/Guardian was advised if they have not already done so to contact the registration department to sign all necessary forms in order for [place] to release information regarding their care.   Consent: Patient/Guardian gives verbal consent for treatment and assignment of benefits for services provided during this visit. Patient/Guardian expressed understanding and agreed to proceed.   Diagnosis: MDD (major depressive disorder), severe (HCC) [F32.2]    1. MDD (major depressive disorder), severe (HCC)   2. Difficulty coping       Donia Guiles, LCSW

## 2021-12-16 NOTE — Psych (Signed)
Virtual Visit via Video Note  I connected with Rebecca Vaughan on 08/25/21 at  9:00 AM EDT by a video enabled telemedicine application and verified that I am speaking with the correct person using two identifiers.  Location: Patient: patient home Provider: clinical home office   I discussed the limitations of evaluation and management by telemedicine and the availability of in person appointments. The patient expressed understanding and agreed to proceed.  I discussed the assessment and treatment plan with the patient. The patient was provided an opportunity to ask questions and all were answered. The patient agreed with the plan and demonstrated an understanding of the instructions.   The patient was advised to call back or seek an in-person evaluation if the symptoms worsen or if the condition fails to improve as anticipated.  Pt was provided 240 minutes of non-face-to-face time during this encounter.   Lorin Glass, LCSW   [facility] PHP THERAPIST PROGRESS NOTE  Rebecca Vaughan [id number]  Session Time: 9:00 - 10:00  Participation Level: Active  Behavioral Response: CasualAlertDepressed  Type of Therapy: Group Therapy  Treatment Goals addressed: Coping  Progress Towards Goals: Progressing  Interventions: CBT, DBT, Supportive, and Reframing  Summary: Clinician led check-in regarding current stressors and situation, and review of patient completed daily inventory. Clinician utilized active listening and empathetic response and validated patient emotions. Clinician facilitated processing group on pertinent issues.?    Summary: Rebecca Vaughan is a 21 y.o. female who presents with depression symptoms. Patient arrived within time allowed. Patient rates her mood at a 5 on a scale of 1-10 with 10 being best. Pt states she feels "upset." Pt reports "family drama" was mixed up yesterday and pt is struggling to manage feelings around it. Pt reports decline in eating, sleeping  poorly, and having self harm thoughts yesterday due to the family issues. Pt reports she was able to distract herself and not act on self harm thoughts.  Patient able to process. Patient engaged in discussion.          Session Time: 10:00 am - 11:00 am   Participation Level: Active   Behavioral Response: CasualAlertDepressed   Type of Therapy: Group Therapy   Treatment Goals addressed: Coping   Progress Towards Goals: Progressing   Interventions: CBT, DBT, Solution Focused, Strength-based, Supportive, and Reframing   Therapist Response: Cln led discussion on family dynamics and the way in which they impact [place]. Group members shared struggles with their families and the way the patterns of behaviors have negatively impacted them. Cln provided space to process and validated pt's experiences.     Therapist Response:  Pt engaged in discussion and is able to process.         Session Time: 11:00 -12:00   Participation Level: Active   Behavioral Response: CasualAlertDepressed   Type of Therapy: Group Therapy, Occupational Therapy   Treatment Goals addressed: Coping   Progress Towards Goals: Progressing   Interventions: Supportive, Education   Summary:  Occupational Therapy group led by cln E. Hollan.   Therapist Response: See OT note         Session Time: 12:00 -1:00   Participation Level: Active   Behavioral Response: CasualAlertDepressed   Type of Therapy: Group therapy   Treatment Goals addressed: Coping   Progress Towards Goals: Progressing   Interventions: CBT; Solution focused; Supportive; Reframing   Summary: 12:00 - 12:50: Cln introduced DBT concept of radical acceptance. Cln discussed radical acceptance as a strategy to decrease distress and  to manage situations outside of their control. Group volunteered struggles they are experiencing with control and cln helped group apply radical acceptance.  12:50 -1:00 Clinician led check-out. Clinician assessed  for immediate needs, medication compliance and efficacy, and safety concerns.   Therapist Response: 12:00 - 12:50: Pt engaged in discussion and identified ways they can apply radical acceptance in their life.  12:50 - 1:00 pm: At check-out, patient reports no immediate concerns. Patient demonstrates little progress as evidenced by managing self harm thoughts in healthy manner. Patient denies SI/HI/self-harm thoughts at the end of group.   Suicidal/Homicidal: Nowithout intent/plan  Plan: Pt will continue in PHP while working to decrease depression symptoms, increase emotion regulation, and increase ability to manage symptoms in a healthy manner.   Collaboration of Care: Medication Management AEB T [place]derman  Patient/Guardian was advised Release of Information must be obtained prior to any record release in order to collaborate their care with an outside provider. Patient/Guardian was advised if they have not already done so to contact the registration department to sign all necessary forms in order for [place] to release information regarding their care.   Consent: Patient/Guardian gives verbal consent for treatment and assignment of benefits for services provided during this visit. Patient/Guardian expressed understanding and agreed to proceed.   Diagnosis: MDD (major depressive disorder), severe ([place]) [F32.2]    1. MDD (major depressive disorder), severe ([place])       Lorin Glass, LCSW

## 2021-12-16 NOTE — Psych (Signed)
Virtual Visit via Video Note  I connected with [name] on 08/24/21 at  9:00 AM EDT by a video enabled telemedicine application and verified that I am speaking with the correct person using two identifiers.  Location: Patient: patient home Provider: clinical home office   I discussed the limitations of evaluation and management by telemedicine and the availability of in person appointments. The patient expressed understanding and agreed to proceed.  I discussed the assessment and treatment plan with the patient. The patient was provided an opportunity to ask questions and all were answered. The patient agreed with the plan and demonstrated an understanding of the instructions.   The patient was advised to call back or seek an in-person evaluation if the symptoms worsen or if the condition fails to improve as anticipated.  Pt was provided 240 minutes of non-face-to-face time during this encounter.   Lorin Glass, LCSW   [facility] PHP THERAPIST PROGRESS NOTE  AMIJAH TIMOTHY [id number]  Session Time: 9:00 - 10:00  Participation Level: None  Behavioral Response: CasualAlertDepressed  Type of Therapy: Group Therapy  Treatment Goals addressed: Coping  Progress Towards Goals: Progressing  Interventions: CBT, DBT, Supportive, and Reframing  Summary: Clinician led check-in regarding current stressors and situation, and review of patient completed daily inventory. Clinician utilized active listening and empathetic response and validated patient emotions. Clinician facilitated processing group on pertinent issues.?    Summary: BENEDICTA SULTAN is a 21 y.o. female who presents with depression symptoms. Patient arrived within time allowed. Patient was non-responsive during this session.          Session Time: 10:00 am - 11:00 am   Participation Level: Minimal   Behavioral Response: CasualAlertDepressed   Type of Therapy: Group Therapy   Treatment Goals addressed:  Coping   Progress Towards Goals: Progressing   Interventions: CBT, DBT, Solution Focused, Strength-based, Supportive, and Reframing   Therapist Response: Cln led discussion on feelings and the role they play for our lives. Cln contextualized feelings as a warning system that brings attention to areas of our lives that need focus. Group members discussed how to pay attention to what the feeling is telling us versus reacting to unpleasant aspects of a feeling.    Therapist Response:  Pt minimally engaged in discussion.           Session Time: 11:00 -12:00   Participation Level: Active   Behavioral Response: CasualAlertDepressed   Type of Therapy: Group Therapy, Occupational Therapy   Treatment Goals addressed: Coping   Progress Towards Goals: Progressing   Interventions: Supportive, Education   Summary:  Occupational Therapy group led by cln E. Hollan.   Therapist Response: See OT note         Session Time: 12:00 -1:00   Participation Level: Minimal   Behavioral Response: CasualAlertDepressed   Type of Therapy: Group therapy   Treatment Goals addressed: Coping   Progress Towards Goals: Progressing   Interventions: CBT; Solution focused; Supportive; Reframing   Summary: 12:00 - 12:50: Cln introduced CBT and the way in which it can provide context for addressing stumbling blocks. Group discussed "the problem is not the problem, the problem is how we're thinking about the problem" and tried to change perspective on current struggles.  12:50 -1:00 Clinician led check-out. Clinician assessed for immediate needs, medication compliance and efficacy, and safety concerns.   Therapist Response: 12:00 - 12:50: Pt engaged in discussion and is able to attempt reframing using CBT.  12:50 - 1:00 pm:  At check-out, patient reports no immediate concerns. Patient demonstrates little progress as evidenced by lack of engagement. Patient denies SI/HI/self-harm thoughts at the end of  group.   Suicidal/Homicidal: Nowithout intent/plan  Plan: Pt will continue in PHP while working to decrease depression symptoms, increase emotion regulation, and increase ability to manage symptoms in a healthy manner.   Collaboration of Care: Medication Management AEB T Wehrly  Patient/Guardian was advised Release of Information must be obtained prior to any record release in order to collaborate their care with an outside provider. Patient/Guardian was advised if they have not already done so to contact the registration department to sign all necessary forms in order for [place] to release information regarding their care.   Consent: Patient/Guardian gives verbal consent for treatment and assignment of benefits for services provided during this visit. Patient/Guardian expressed understanding and agreed to proceed.   Diagnosis: MDD (major depressive disorder), severe (HCC) [F32.2]    1. MDD (major depressive disorder), severe (HCC)       Donia Guiles, LCSW

## 2021-12-16 NOTE — Psych (Signed)
Virtual Visit via Video Note  I connected with Rebecca Vaughan on 08/31/21 at  9:00 AM EDT by a video enabled telemedicine application and verified that I am speaking with the correct person using two identifiers.  Location: Patient: patient home Provider: clinical home office   I discussed the limitations of evaluation and management by telemedicine and the availability of in person appointments. The patient expressed understanding and agreed to proceed.  I discussed the assessment and treatment plan with the patient. The patient was provided an opportunity to ask questions and all were answered. The patient agreed with the plan and demonstrated an understanding of the instructions.   The patient was advised to call back or seek an in-person evaluation if the symptoms worsen or if the condition fails to improve as anticipated.  Pt was provided 240 minutes of non-face-to-face time during this encounter.   Donia Guiles, LCSW   [facility] PHP THERAPIST PROGRESS NOTE  Rebecca Vaughan [id number]  Session Time: 9:00 - 10:00  Participation Level: Active  Behavioral Response: CasualAlertDepressed  Type of Therapy: Group Therapy  Treatment Goals addressed: Coping  Progress Towards Goals: Progressing  Interventions: CBT, DBT, Supportive, and Reframing  Summary: Clinician led check-in regarding current stressors and situation, and review of patient completed daily inventory. Clinician utilized active listening and empathetic response and validated patient emotions. Clinician facilitated processing group on pertinent issues.?    Summary: Rebecca Vaughan is a 21 y.o. female who presents with depression symptoms. Patient arrived within time allowed. Patient rates her mood at a 8 on a scale of 1-10 with 10 being best. Pt states she feels "okay." Pt reports she had a court date yesterday and it was continued but is overall positive. Pt states she saw some family and took a walk with her  dogs. Pt reports she slept 9 hours which is helping her mood. Pt struggles with consistency. Patient able to process. Patient engaged in discussion.          Session Time: 10:00 am - 11:00 am   Participation Level: Active   Behavioral Response: CasualAlertDepressed   Type of Therapy: Group Therapy   Treatment Goals addressed: Coping   Progress Towards Goals: Progressing   Interventions: CBT, DBT, Solution Focused, Strength-based, Supportive, and Reframing   Therapist Response: Cln led discussion on safety and the ways in which we can seek and own it in ourselves. Group members shared how they view safety and situations which hinder safety. Cln encouraged pt's to think about emotional safety and ways to foster it.     Therapist Response: Pt engaged in discussion and reports gaining insight.          Session Time: 11:00 -12:00   Participation Level: Active   Behavioral Response: CasualAlertDepressed   Type of Therapy: Group Therapy, Occupational Therapy   Treatment Goals addressed: Coping   Progress Towards Goals: Progressing   Interventions: Supportive, Education   Summary:  Occupational Therapy group led by cln E. Hollan.   Therapist Response: See OT note       Session Time: 12:00 -1:00   Participation Level: Active   Behavioral Response: CasualAlertDepressed   Type of Therapy: Group therapy   Treatment Goals addressed: Coping   Progress Towards Goals: Progressing   Interventions: CBT; Solution focused; Supportive; Reframing   Summary: 12:00 - 12:50: Cln continued topic of CBT cognitive distortions and introduced thought challenging as a way to  utilize the "challenge" C in C-C-C. Group utilized Administrator, [employer]  questions" as a way to introduce challenges and reframe distorted thinking. Group members worked through pt examples to practice challenging distorted thinking.  12:50 -1:00 Clinician led check-out. Clinician assessed for immediate needs,  medication compliance and efficacy, and safety concerns.   Therapist Response: 12:00 - 12:50: Pt engaged in discussion and demonstrates understanding of challenging distorted thoughts through practice.  12:50 - 1:00 pm: At check-out, patient reports no immediate concerns. Patient demonstrates little progress as evidenced by increased willingness to engage in positive events. Patient denies SI/HI/self-harm thoughts at the end of group.   Suicidal/Homicidal: Nowithout intent/plan  Plan: Pt will continue in PHP while working to decrease depression symptoms, increase emotion regulation, and increase ability to manage symptoms in a healthy manner.   Collaboration of Care: Medication Management AEB T Molter  Patient/Guardian was advised Release of Information must be obtained prior to any record release in order to collaborate their care with an outside provider. Patient/Guardian was advised if they have not already done so to contact the registration department to sign all necessary forms in order for [place] to release information regarding their care.   Consent: Patient/Guardian gives verbal consent for treatment and assignment of benefits for services provided during this visit. Patient/Guardian expressed understanding and agreed to proceed.   Diagnosis: MDD (major depressive disorder), severe ([place]) [F32.2]    1. MDD (major depressive disorder), severe ([place])       Lorin Glass, LCSW

## 2021-12-16 NOTE — Psych (Signed)
Virtual Visit via Video Note  I connected with Rebecca Vaughan on 08/23/21 at  9:00 AM EDT by a video enabled telemedicine application and verified that I am speaking with the correct person using two identifiers.  Location: Patient: patient home Provider: clinical home office   I discussed the limitations of evaluation and management by telemedicine and the availability of in person appointments. The patient expressed understanding and agreed to proceed.  I discussed the assessment and treatment plan with the patient. The patient was provided an opportunity to ask questions and all were answered. The patient agreed with the plan and demonstrated an understanding of the instructions.   The patient was advised to call back or seek an in-person evaluation if the symptoms worsen or if the condition fails to improve as anticipated.  Pt was provided 240 minutes of non-face-to-face time during this encounter.   Lorin Glass, LCSW   [facility] PHP THERAPIST PROGRESS NOTE  Rebecca Vaughan [id number]  Session Time: 9:00 - 10:00  Participation Level: Active  Behavioral Response: CasualAlertDepressed  Type of Therapy: Group Therapy  Treatment Goals addressed: Coping  Progress Towards Goals: Progressing  Interventions: CBT, DBT, Supportive, and Reframing  Summary: Clinician led check-in regarding current stressors and situation, and review of patient completed daily inventory. Clinician utilized active listening and empathetic response and validated patient emotions. Clinician facilitated processing group on pertinent issues.?    Summary: Rebecca Vaughan is a 21 y.o. female who presents with depression symptoms. Patient arrived within time allowed. Patient rates her mood at a 9 on a scale of 1-10 with 10 being best. Pt states she feels "good." Pt reports waking up in a good mood. Pt reports she got out of the house yesterday and ran errands. Pt reports improved sleep due to increased  activity during the day. Pt struggles with mood dependent thinking. Patient able to process. Patient engaged in discussion.          Session Time: 10:00 am - 11:00 am   Participation Level: Active   Behavioral Response: CasualAlertDepressed   Type of Therapy: Group Therapy   Treatment Goals addressed: Coping   Progress Towards Goals: Progressing   Interventions: CBT, DBT, Solution Focused, Strength-based, Supportive, and Reframing   Therapist Response: Cln led processing group for pt's current struggles. Group members shared stressors and provided support and feedback. Cln brought in topics of boundaries, healthy relationships, and unhealthy thought processes to inform discussion.   Therapist Response: Pt able to process and provide support to group.            Session Time: 11:00 -12:00   Participation Level: Active   Behavioral Response: CasualAlertDepressed   Type of Therapy: Group Therapy, Spiritual Care   Treatment Goals addressed: Coping   Progress Towards Goals: Progressing   Interventions: Supportive, Education   Summary:  Alain Marion, Chaplain, led group.   Therapist Response: Pt participated        Session Time: 12:00 -1:00   Participation Level: Active   Behavioral Response: CasualAlertDepressed   Type of Therapy: Group Therapy   Treatment Goals addressed: Coping   Progress Towards Goals: Progressing   Interventions: CBT, DBT, Solution Focused, Strength-based, Supportive, and Reframing   Summary: Cln introduced topic of DBT Self-Soothe skills. Group discussed ways they can utilize the five senses to soothe themselves when struggling.  12:50 -1:00 Clinician led check-out. Clinician assessed for immediate needs, medication compliance and efficacy, and safety concerns   Therapist Response: 12:00 - 12:50:  Pt engaged in discussion and is able to determine ways to utilize each of the five senses. 12:50 - 1:00 pm: At check-out, patient reports no  immediate concerns. Patient demonstrates progress as evidenced by getting out of the house. Patient denies SI/HI/self-harm thoughts at the end of group.   Suicidal/Homicidal: Nowithout intent/plan  Plan: Pt will continue in PHP while working to decrease depression symptoms, increase emotion regulation, and increase ability to manage symptoms in a healthy manner.   Collaboration of Care: Medication Management AEB T [place]  Patient/Guardian was advised Release of Information must be obtained prior to any record release in order to collaborate their care with an outside provider. Patient/Guardian was advised if they have not already done so to contact the registration department to sign all necessary forms in order for [place] to release information regarding their care.   Consent: Patient/Guardian gives verbal consent for treatment and assignment of benefits for services provided during this visit. Patient/Guardian expressed understanding and agreed to proceed.   Diagnosis: MDD (major depressive disorder), severe (HCC) [F32.2]    1. MDD (major depressive disorder), severe (HCC)       Donia Guiles, LCSW

## 2021-12-16 NOTE — Psych (Signed)
Virtual Visit via Video Note  I connected with Rebecca Vaughan on 09/11/21 at  9:00 AM EDT by a video enabled telemedicine application and verified that I am speaking with the correct person using two identifiers.  Location: Patient: patient home Provider: clinical home office   I discussed the limitations of evaluation and management by telemedicine and the availability of in person appointments. The patient expressed understanding and agreed to proceed.  I discussed the assessment and treatment plan with the patient. The patient was provided an opportunity to ask questions and all were answered. The patient agreed with the plan and demonstrated an understanding of the instructions.   The patient was advised to call back or seek an in-person evaluation if the symptoms worsen or if the condition fails to improve as anticipated.  Pt was provided 240 minutes of non-face-to-face time during this encounter.   Lorin Glass, LCSW   [facility] PHP THERAPIST PROGRESS NOTE  Rebecca Vaughan [id number]  Session Time: 9:00 - 10:00  Participation Level: Active  Behavioral Response: CasualAlertDepressed  Type of Therapy: Group Therapy  Treatment Goals addressed: Coping  Progress Towards Goals: Progressing  Interventions: CBT, DBT, Supportive, and Reframing  Summary: Clinician led check-in regarding current stressors and situation, and review of patient completed daily inventory. Clinician utilized active listening and empathetic response and validated patient emotions. Clinician facilitated processing group on pertinent issues.?    Summary: Rebecca Vaughan is a 21 y.o. female who presents with depression symptoms. Patient arrived within time allowed. Patient rates her mood at a 5 on a scale of 1-10 with 10 being best. Pt states she feels "okay." Pt reports a family health issue has disrupted her routines. Pt reports stress with some family members and having to work with them. Pt states  she is eating and sleeping well. Pt reports desire to have routine. Patient able to process. Patient engaged in discussion.          Session Time: 10:00 am - 11:00 am   Participation Level: Active   Behavioral Response: CasualAlertDepressed   Type of Therapy: Group Therapy   Treatment Goals addressed: Coping   Progress Towards Goals: Progressing   Interventions: CBT, DBT, Solution Focused, Strength-based, Supportive, and Reframing   Therapist Response: Cln led discussion on ways to manage our feelings. Cln encouraged pt's to rank their feeling in intensity 1-10 with 10 being most intense. If feeling is ranked 5+, pt's main job is to manage the feeling via coping skill. If the feeling is ranked 4 or below, coping or problem solving can be attempted. Cln brought in CBT thought challenging and DBT distress tolerance skills to aid discussion. Group members discussed how to apply and utilize this information.     Therapist Response: Pt engaged in discussion and reports understanding and previous struggle with wanting to problem solve when emotionally escalated.          Session Time: 11:00 -12:00   Participation Level: Active   Behavioral Response: CasualAlertDepressed   Type of Therapy: Group Therapy, Occupational Therapy   Treatment Goals addressed: Coping   Progress Towards Goals: Progressing   Interventions: Supportive, Education   Summary:  Occupational Therapy group led by cln E. Hollan.   Therapist Response: See OT note     Session Time: 12:00 -1:00   Participation Level: Active   Behavioral Response: CasualAlertDepressed   Type of Therapy: Group therapy   Treatment Goals addressed: Coping   Progress Towards Goals: Progressing   Interventions:  CBT; Solution focused; Supportive; Reframing   Summary: 12:00 - 12:50: Cln led discussion on CBT core beliefs. Cln provided context for core beliefs and how they impact our perception. Group members discussed  possible core beliefs they hold and how it is affecting them.  12:50 -1:00 Clinician led check-out. Clinician assessed for immediate needs, medication compliance and efficacy, and safety concerns.   Therapist Response: 12:00 - 12:50: Pt engaged in discussion and is able to identify core beliefs they experience.  12:50 - 1:00 pm: At check-out, patient reports no immediate concerns. Patient demonstrates little progress as evidenced by increased mood stability. Patient denies SI/HI/self-harm thoughts at the end of group.   Suicidal/Homicidal: Nowithout intent/plan  Plan: Pt will continue in PHP while working to decrease depression symptoms, increase emotion regulation, and increase ability to manage symptoms in a healthy manner.   Collaboration of Care: Medication Management AEB T [place]  Patient/Guardian was advised Release of Information must be obtained prior to any record release in order to collaborate their care with an outside provider. Patient/Guardian was advised if they have not already done so to contact the registration department to sign all necessary forms in order for [place] to release information regarding their care.   Consent: Patient/Guardian gives verbal consent for treatment and assignment of benefits for services provided during this visit. Patient/Guardian expressed understanding and agreed to proceed.   Diagnosis: MDD (major depressive disorder), severe (HCC) [F32.2]    1. MDD (major depressive disorder), severe (HCC)       Donia Guiles, LCSW

## 2022-05-25 IMAGING — US US PELVIS COMPLETE WITH TRANSVAGINAL
1 series · 13 of 25 positions shown · non-contrast
Comparison: Pelvic ultrasound dated March 23, 2020. CT abdomen
pelvis dated March 23, 2020.

CLINICAL DATA: Ovarian cyst follow-up.



[Series 1: us pelvis complete with transvaginal · 0.22mm/px · 71 acquisitions, 13 frames shown]
[im 1/71]
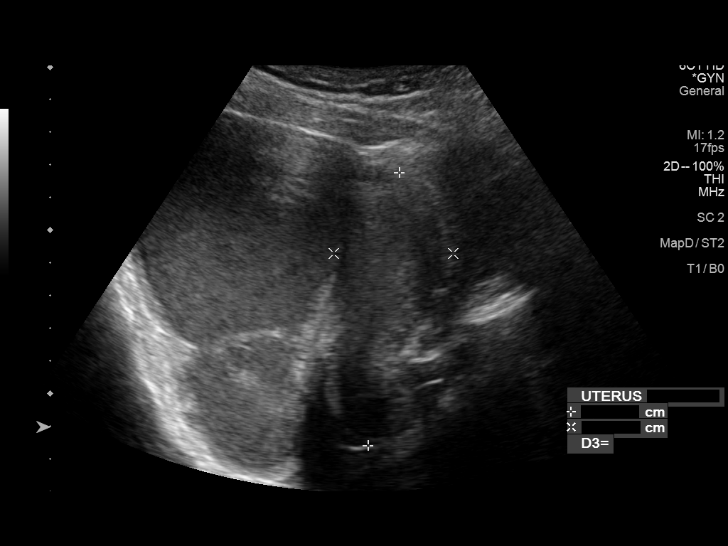
[im 6/71]
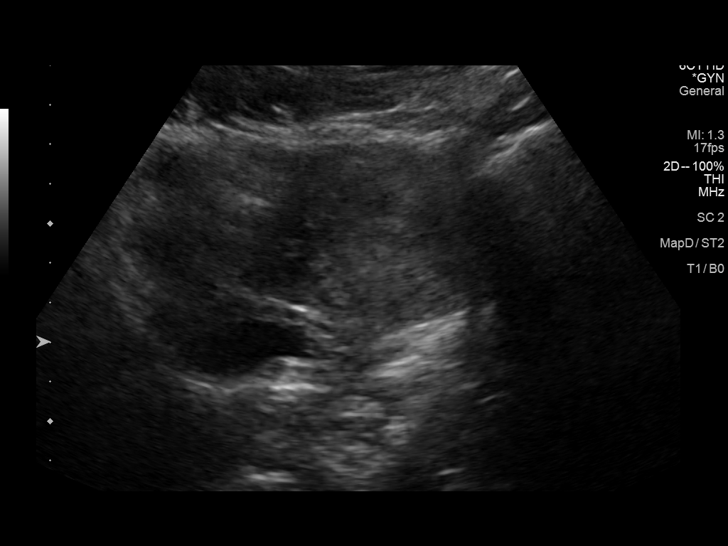
[im 12/71]
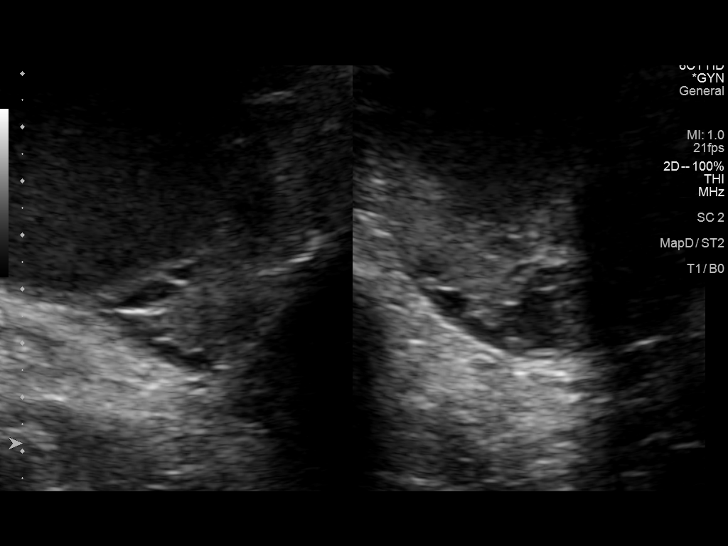
[im 18/71]
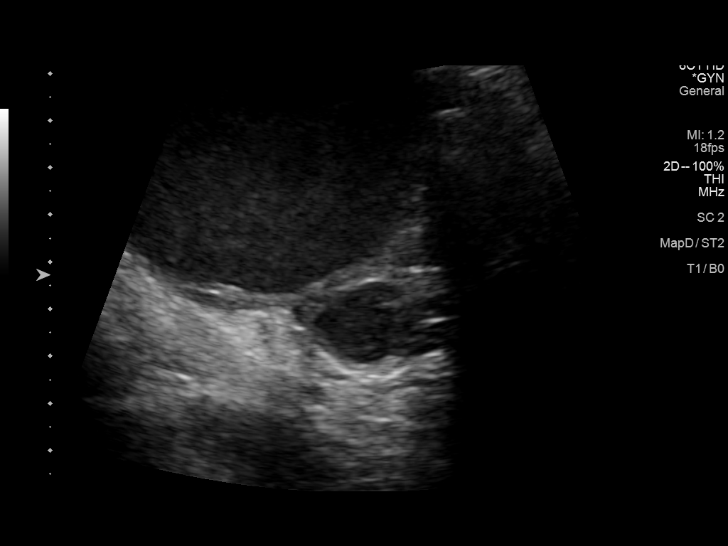
[im 24/71]
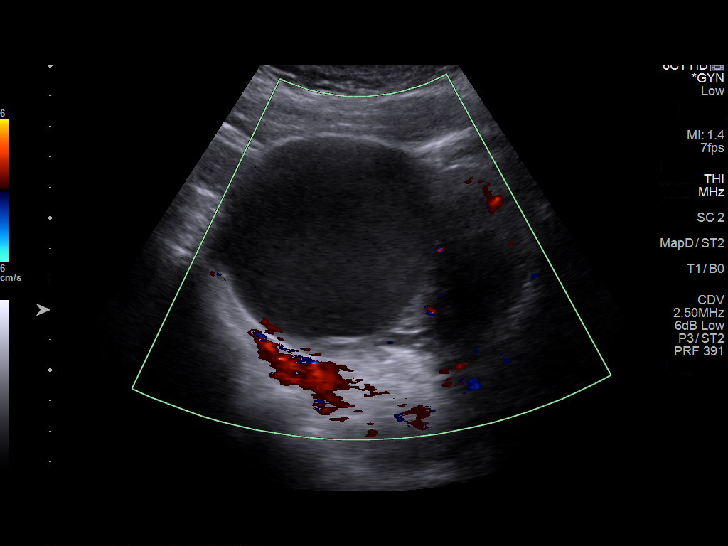
[im 30/71]
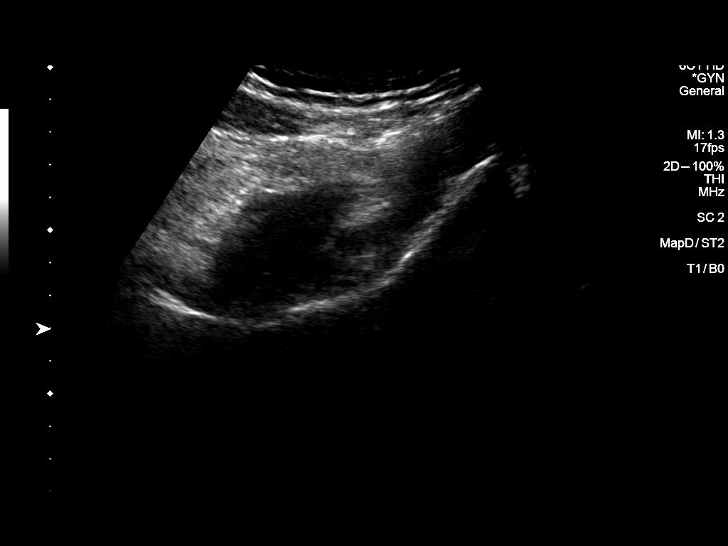
[im 36/71]
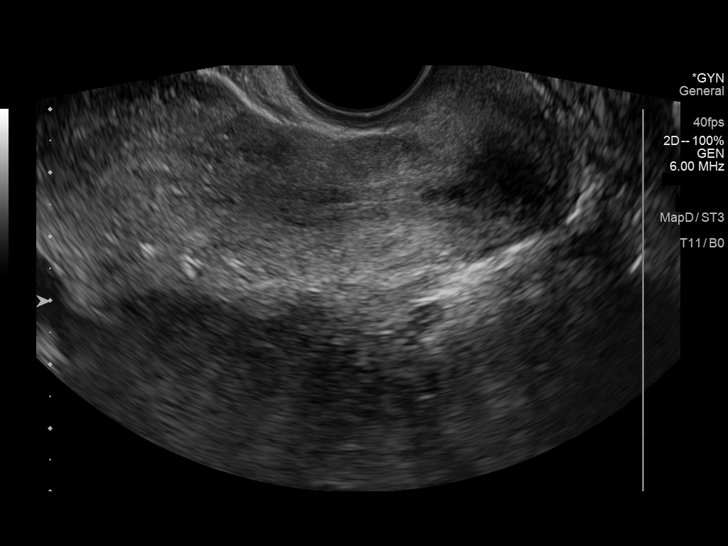
[im 41/71]
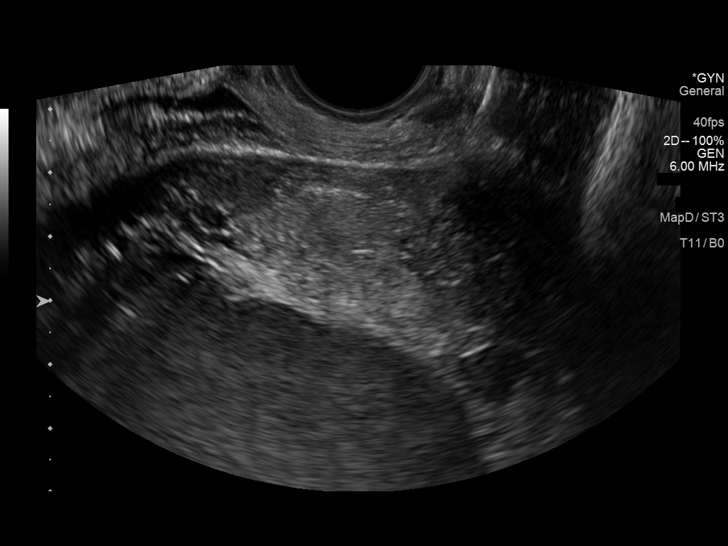
[im 47/71]
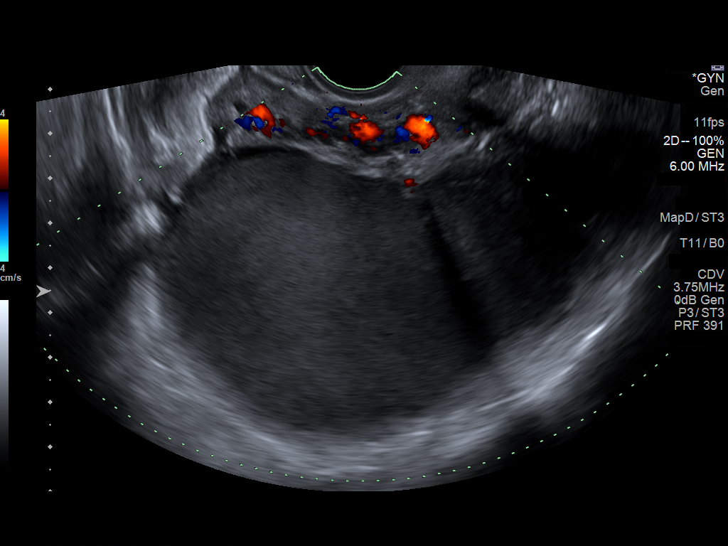
[im 53/71]
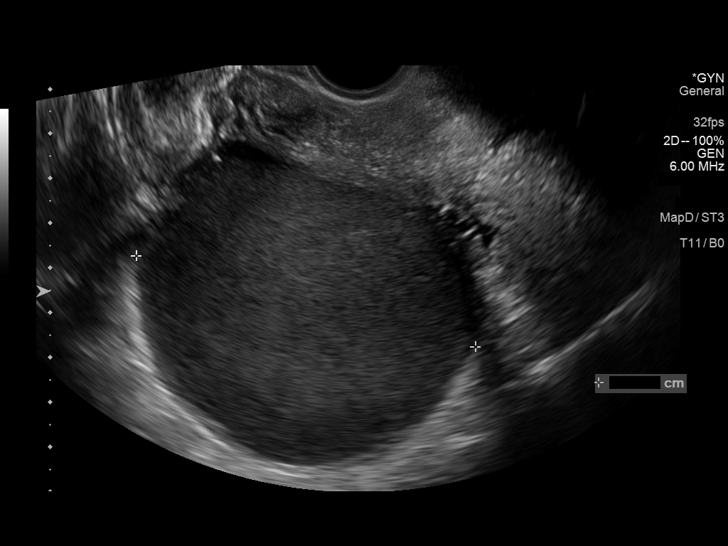
[im 59/71]
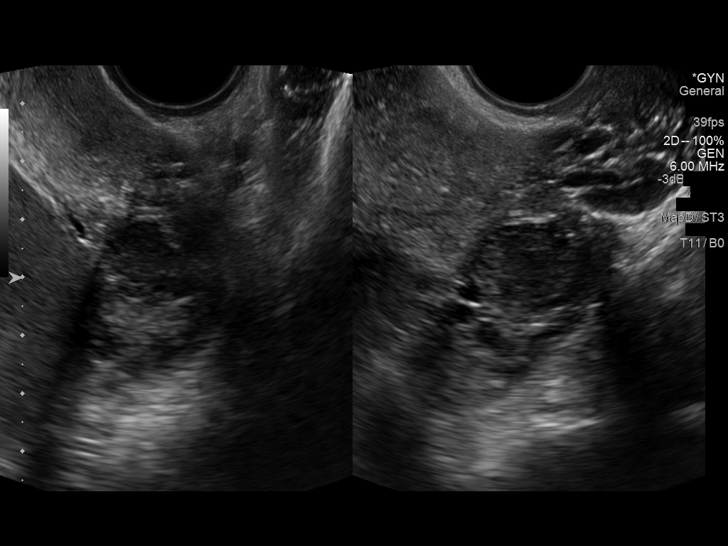
[im 65/71]
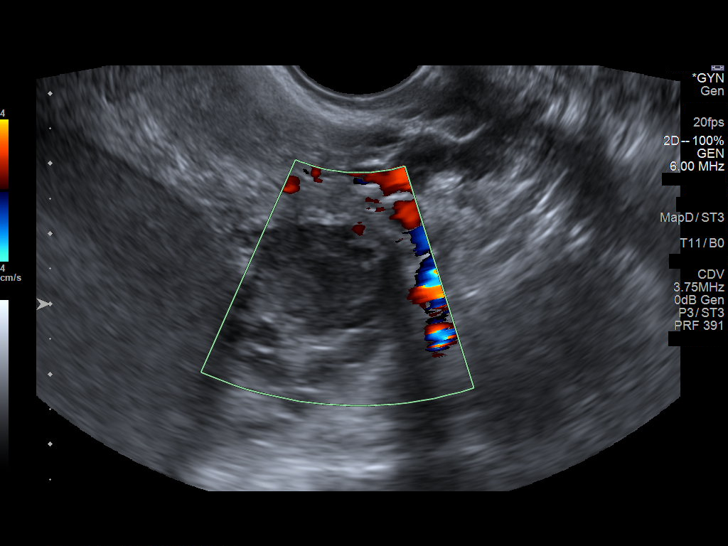
[im 71/71]
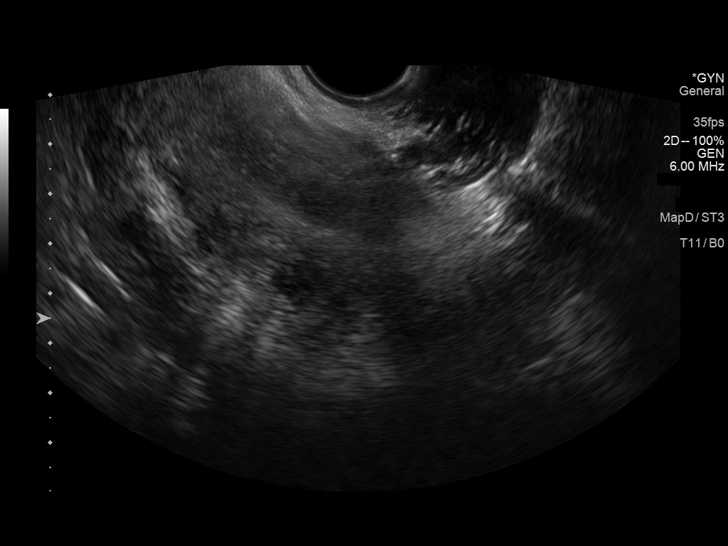

[13 of 25 positions shown; findings below may reference images not displayed]

FINDINGS: Uterus

Measurements: 8.4 x 3.7 x 5.4 cm = volume: 87 mL. No fibroids or
other mass visualized.

Endometrium

Thickness: 4 mm.  No focal abnormality visualized.

Right ovary

Measurements: 12.1 x 10.2 x 6.6 cm = volume: 428 mL. 10.0 x 6.2 x
7.8 cm complex multiloculated cystic lesion with homogeneous low
level internal echoes and multiple thin internal septations. No
definite solid or nodular component. This is similar in appearance
to the prior study.

Left ovary

Measurements: 2.5 x 2.8 x 3.8 cm = volume: 15 mL. 2.0 x 1.6 x 1.6 cm
complex cystic lesion with heterogeneous low level internal echoes.
This is similar in appearance to the prior study.

Other findings

No abnormal free fluid.
IMPRESSION: 1. Unchanged large 10.0 cm complex multiloculated cystic lesion
within the right ovary. The loculated appearance and multiple thin
internal septations raises concern for cystic neoplasm, possibly a
mucinous cystadenoma. Gynecological oncology consultation is
recommended for further evaluation.
2. Unchanged 2.0 cm complex cystic lesion within the left ovary,
possibly a hemorrhagic cyst or endometrioma. Follow-up ultrasound in
6-9 weeks is recommended to evaluate for interval change.

## 2023-02-12 IMAGING — CT CT ABD-PELV W/ CM
2 of 4 series · 16 of 46 positions shown, 18 images · IV contrast (omnipaque)
Comparison: 03/23/2020

CLINICAL DATA: Abdominal abscess or infection suspected.  Pain.

EXAM:
CT ABDOMEN AND PELVIS WITH CONTRAST
TECHNIQUE: Multidetector CT imaging of the abdomen and pelvis was performed
using the standard protocol following bolus administration of
intravenous contrast.
CONTRAST:  75mL OMNIPAQUE IOHEXOL 350 MG/ML SOLN

[Series 3: a/p w/ 5mm · axial · 0.88mm/px · z∈[+822,+1272]mm · 13 of 100 slices shown, 15 images]
[im 5/100  soft-tissue]
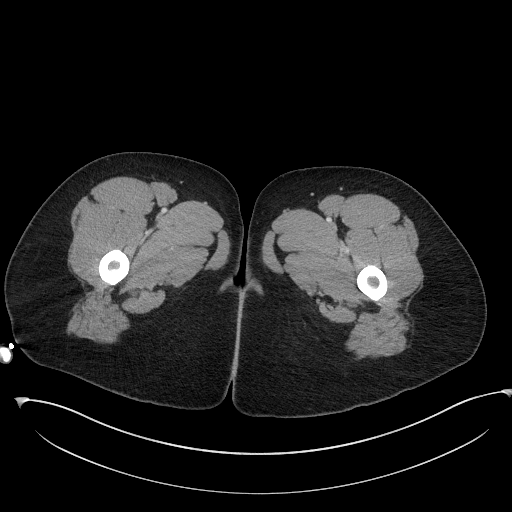
[im 5/100  bone]
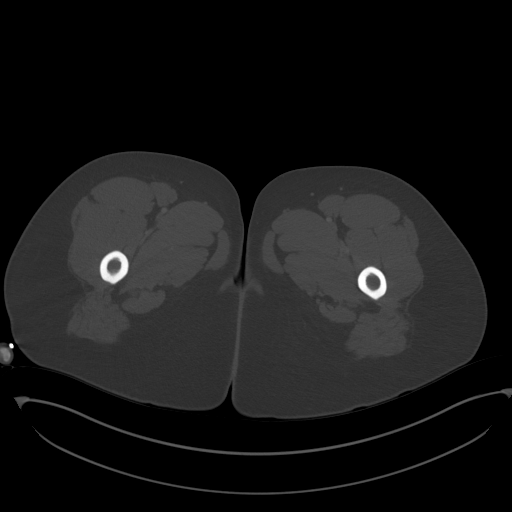
[im 13/100  soft-tissue]
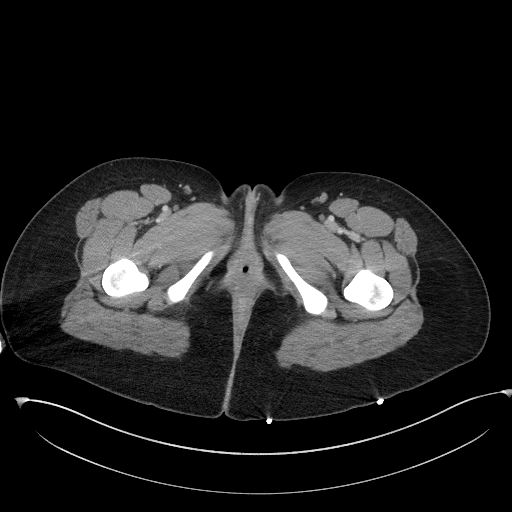
[im 22/100  soft-tissue]
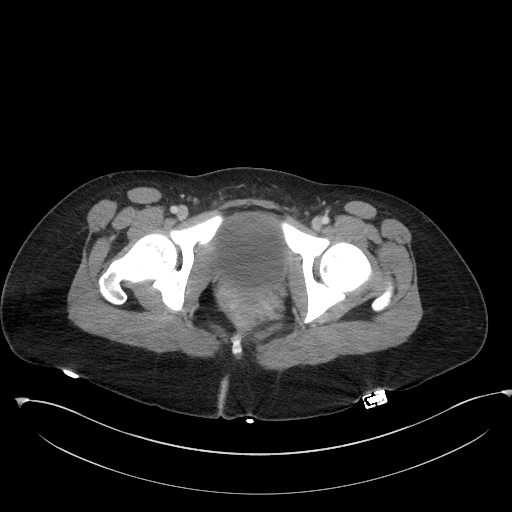
[im 26/100  soft-tissue]
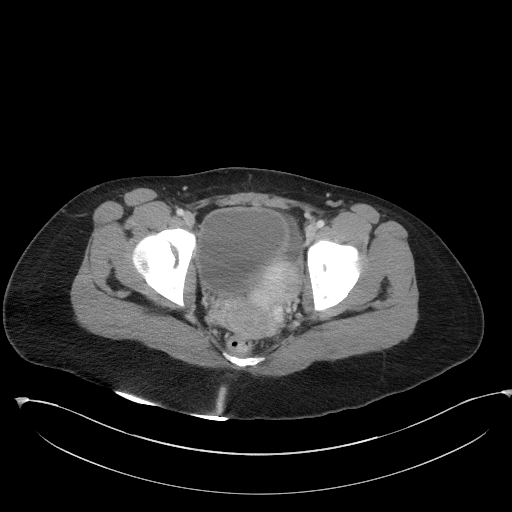
[im 35/100  soft-tissue]
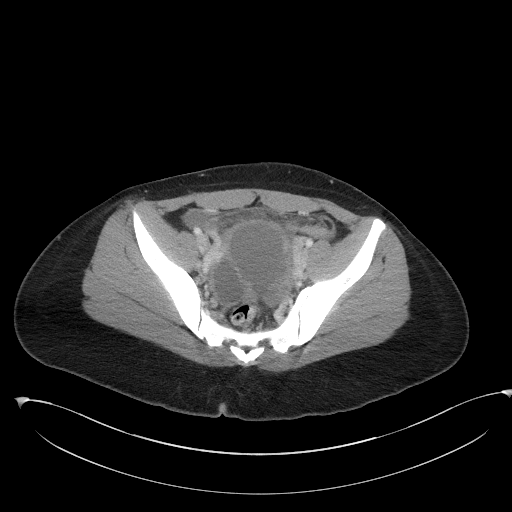
[im 44/100  soft-tissue]
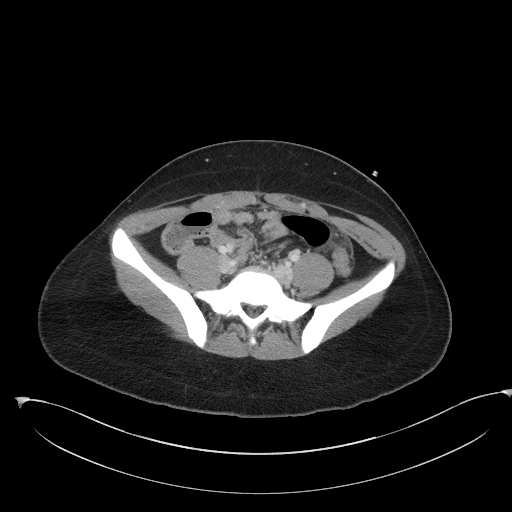
[im 52/100  soft-tissue]
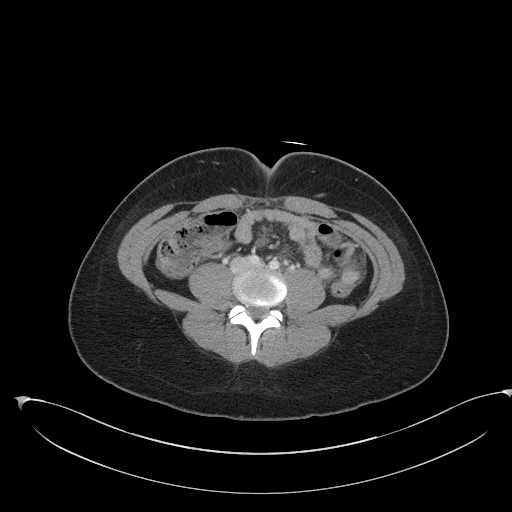
[im 56/100  soft-tissue]
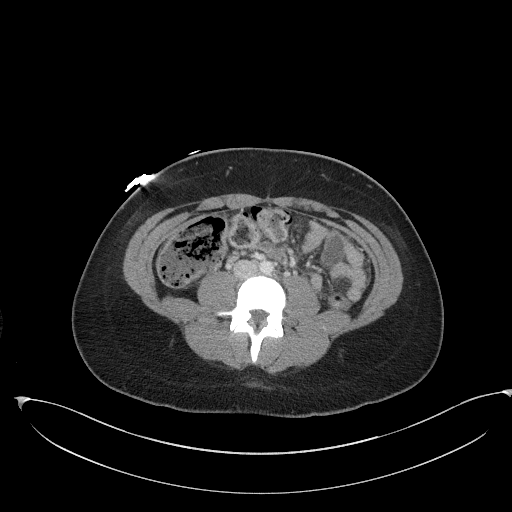
[im 65/100  soft-tissue]
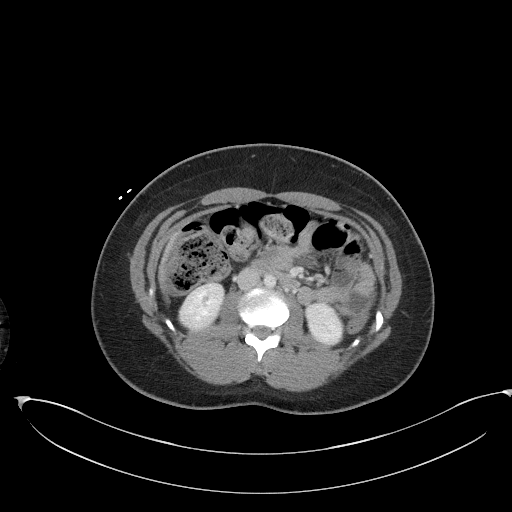
[im 65/100  bone]
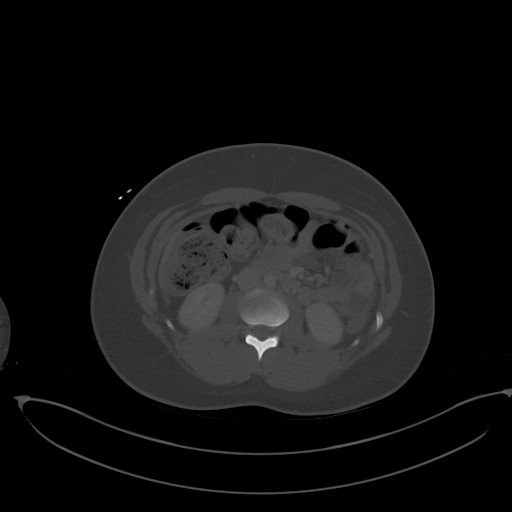
[im 74/100  soft-tissue]
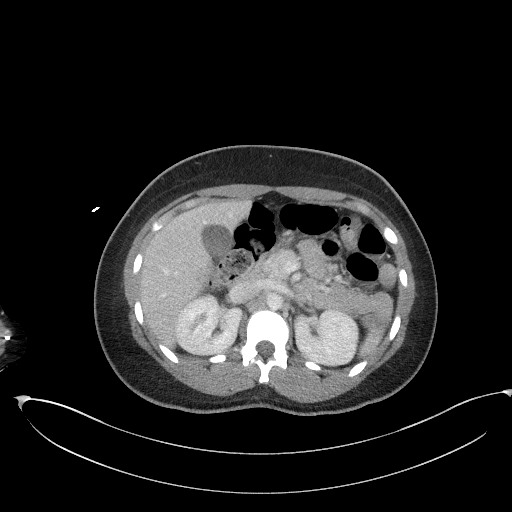
[im 78/100  soft-tissue]
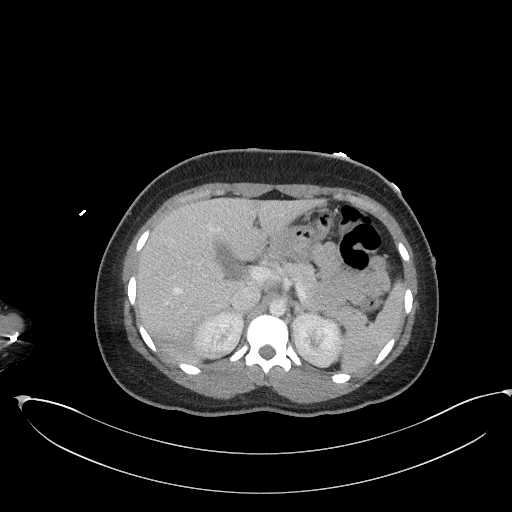
[im 87/100  soft-tissue]
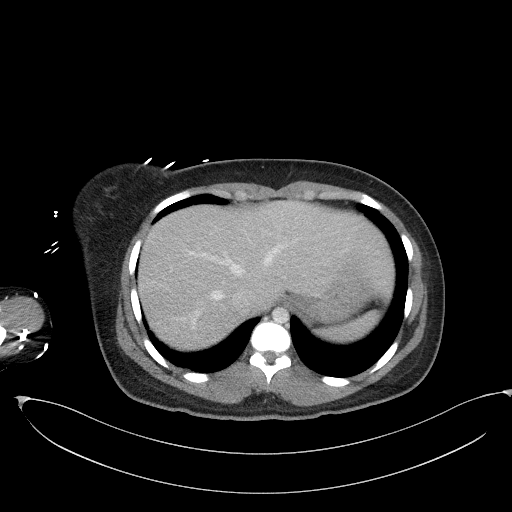
[im 95/100  soft-tissue]
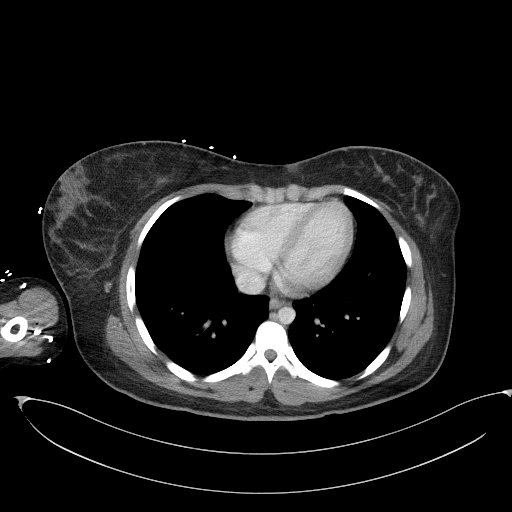

[Series 6: a/p w/ cor · coronal · 0.69mm/px · 3 of 138 slices shown]
[im 46/138  soft-tissue]
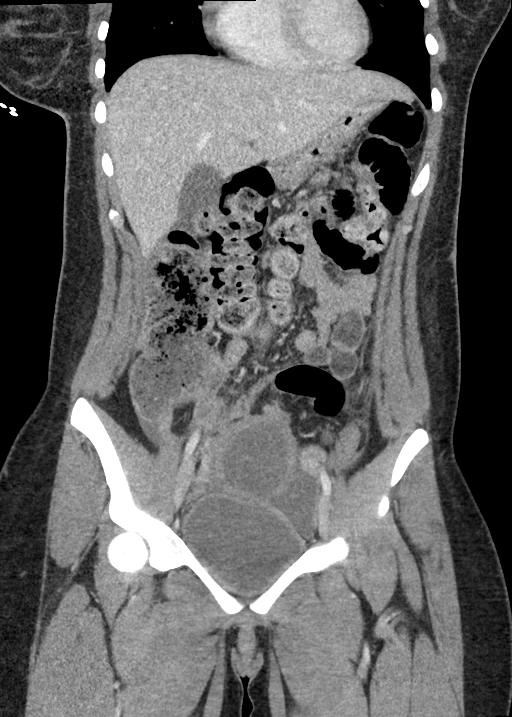
[im 61/138  soft-tissue]
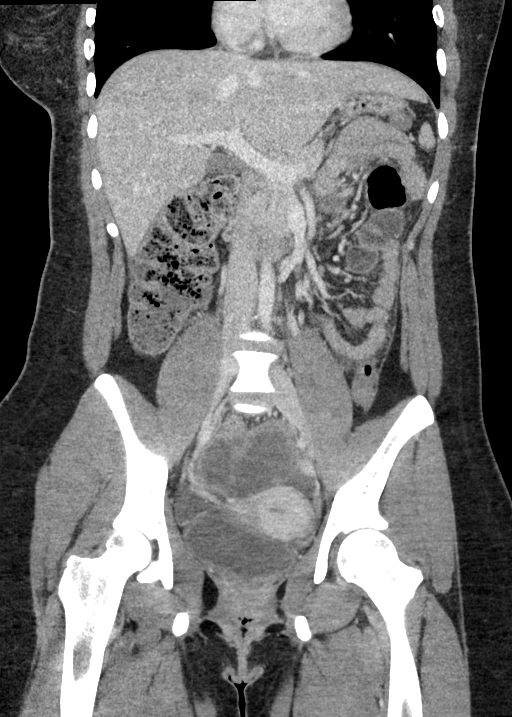
[im 77/138  soft-tissue]
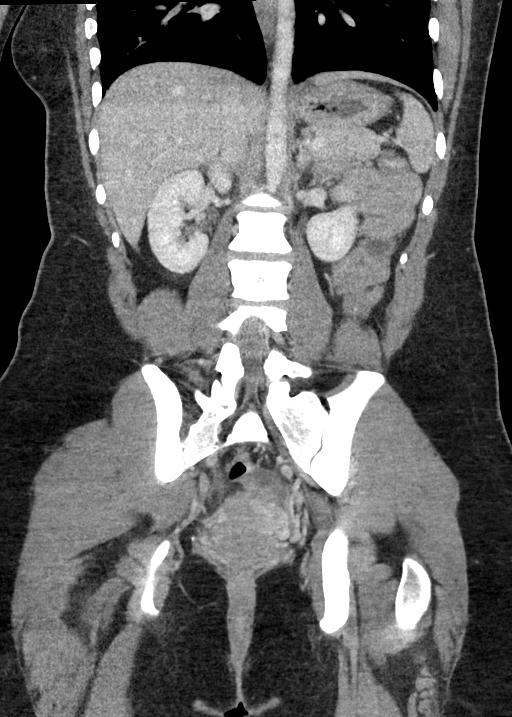

[16 of 46 positions shown; findings below may reference images not displayed]

FINDINGS: Lower chest:  No contributory findings.

Hepatobiliary: No worrisome liver abnormality.No evidence of biliary
obstruction or stone.

Pancreas: Unremarkable.

Spleen: Unremarkable.

Adrenals/Urinary Tract: Negative adrenals. No hydronephrosis or
stone. Unremarkable bladder.

Stomach/Bowel:  No obstruction. No appendicitis.

Vascular/Lymphatic: No acute vascular abnormality. No mass or
adenopathy.

Reproductive history of endometrial mass with interval decompression
recurrent and L thick walled midline to right paramedian collections
in the pelvis, the larger of midline measuring up to 7 cm in more
lateral component measuring 4 cm. Regional fat stranding and
peritoneal fluid. The right ovary is difficult to separate from the
cystic pelvic masses; no visible edematous ovary.

Other: No ascites or pneumoperitoneum. Presumed dermal inclusion
cyst in the subcutaneous left paramedian lower chest

Musculoskeletal: No acute abnormalities.
IMPRESSION: History of endometriomas with residual or recurrent collections in
the pelvis that are newly thick walled with regional inflammatory
changes suggesting superinfection. The largest measures 7 cm
